# Patient Record
Sex: Male | Born: 1960 | Race: White | Hispanic: No | Marital: Married | Smoking: Former smoker
Health system: Southern US, Community
[De-identification: ages and names within clinical notes are randomized; demographics above are authoritative.]

## PROBLEM LIST (undated history)

## (undated) DIAGNOSIS — I4892 Unspecified atrial flutter: Secondary | ICD-10-CM

## (undated) DIAGNOSIS — N200 Calculus of kidney: Secondary | ICD-10-CM

## (undated) DIAGNOSIS — I2510 Atherosclerotic heart disease of native coronary artery without angina pectoris: Secondary | ICD-10-CM

## (undated) DIAGNOSIS — I10 Essential (primary) hypertension: Secondary | ICD-10-CM

## (undated) DIAGNOSIS — E669 Obesity, unspecified: Secondary | ICD-10-CM

## (undated) DIAGNOSIS — N201 Calculus of ureter: Secondary | ICD-10-CM

## (undated) DIAGNOSIS — E785 Hyperlipidemia, unspecified: Secondary | ICD-10-CM

## (undated) DIAGNOSIS — I509 Heart failure, unspecified: Secondary | ICD-10-CM

## (undated) DIAGNOSIS — E119 Type 2 diabetes mellitus without complications: Secondary | ICD-10-CM

## (undated) HISTORY — DX: Calculus of kidney: N20.0

## (undated) HISTORY — DX: Obesity, unspecified: E66.9

## (undated) HISTORY — DX: Atherosclerotic heart disease of native coronary artery without angina pectoris: I25.10

## (undated) HISTORY — DX: Unspecified atrial flutter: I48.92

## (undated) HISTORY — DX: Essential (primary) hypertension: I10

## (undated) HISTORY — PX: OTHER SURGICAL HISTORY: SHX169

## (undated) HISTORY — DX: Hyperlipidemia, unspecified: E78.5

## (undated) HISTORY — DX: Type 2 diabetes mellitus without complications: E11.9

---

## 2001-07-18 ENCOUNTER — Ambulatory Visit (HOSPITAL_COMMUNITY): Admission: RE | Admit: 2001-07-18 | Discharge: 2001-07-18 | Payer: Self-pay | Admitting: Family Medicine

## 2001-07-18 ENCOUNTER — Encounter: Payer: Self-pay | Admitting: Family Medicine

## 2001-11-28 ENCOUNTER — Encounter: Payer: Self-pay | Admitting: Family Medicine

## 2001-11-28 ENCOUNTER — Ambulatory Visit (HOSPITAL_COMMUNITY): Admission: RE | Admit: 2001-11-28 | Discharge: 2001-11-28 | Payer: Self-pay | Admitting: Family Medicine

## 2003-02-01 ENCOUNTER — Encounter: Payer: Self-pay | Admitting: Family Medicine

## 2003-02-01 ENCOUNTER — Ambulatory Visit (HOSPITAL_COMMUNITY): Admission: RE | Admit: 2003-02-01 | Discharge: 2003-02-01 | Payer: Self-pay | Admitting: Family Medicine

## 2003-08-05 ENCOUNTER — Other Ambulatory Visit: Admission: RE | Admit: 2003-08-05 | Discharge: 2003-08-05 | Payer: Self-pay | Admitting: Dermatology

## 2004-07-02 ENCOUNTER — Ambulatory Visit (HOSPITAL_COMMUNITY): Admission: RE | Admit: 2004-07-02 | Discharge: 2004-07-02 | Payer: Self-pay | Admitting: Family Medicine

## 2004-09-13 HISTORY — PX: OTHER SURGICAL HISTORY: SHX169

## 2005-01-12 ENCOUNTER — Emergency Department (HOSPITAL_COMMUNITY): Admission: EM | Admit: 2005-01-12 | Discharge: 2005-01-13 | Payer: Self-pay | Admitting: *Deleted

## 2005-01-13 ENCOUNTER — Inpatient Hospital Stay (HOSPITAL_COMMUNITY): Admission: AD | Admit: 2005-01-13 | Discharge: 2005-01-15 | Payer: Self-pay | Admitting: *Deleted

## 2005-01-13 HISTORY — PX: CARDIAC CATHETERIZATION: SHX172

## 2005-02-12 ENCOUNTER — Encounter: Admission: RE | Admit: 2005-02-12 | Discharge: 2005-02-12 | Payer: Self-pay | Admitting: *Deleted

## 2005-05-07 ENCOUNTER — Emergency Department (HOSPITAL_COMMUNITY): Admission: EM | Admit: 2005-05-07 | Discharge: 2005-05-07 | Payer: Self-pay | Admitting: Emergency Medicine

## 2005-08-27 IMAGING — CR DG CHEST 2V
2 series · 2 of 2 positions shown · non-contrast
Comparison: 01/12/05.

CLINICAL DATA: Myocardial infarction last month.  Tightness in the chest.  Stent placement last month. 
 2-VIEW CHEST RADIOGRAPH:

[w chest pa]
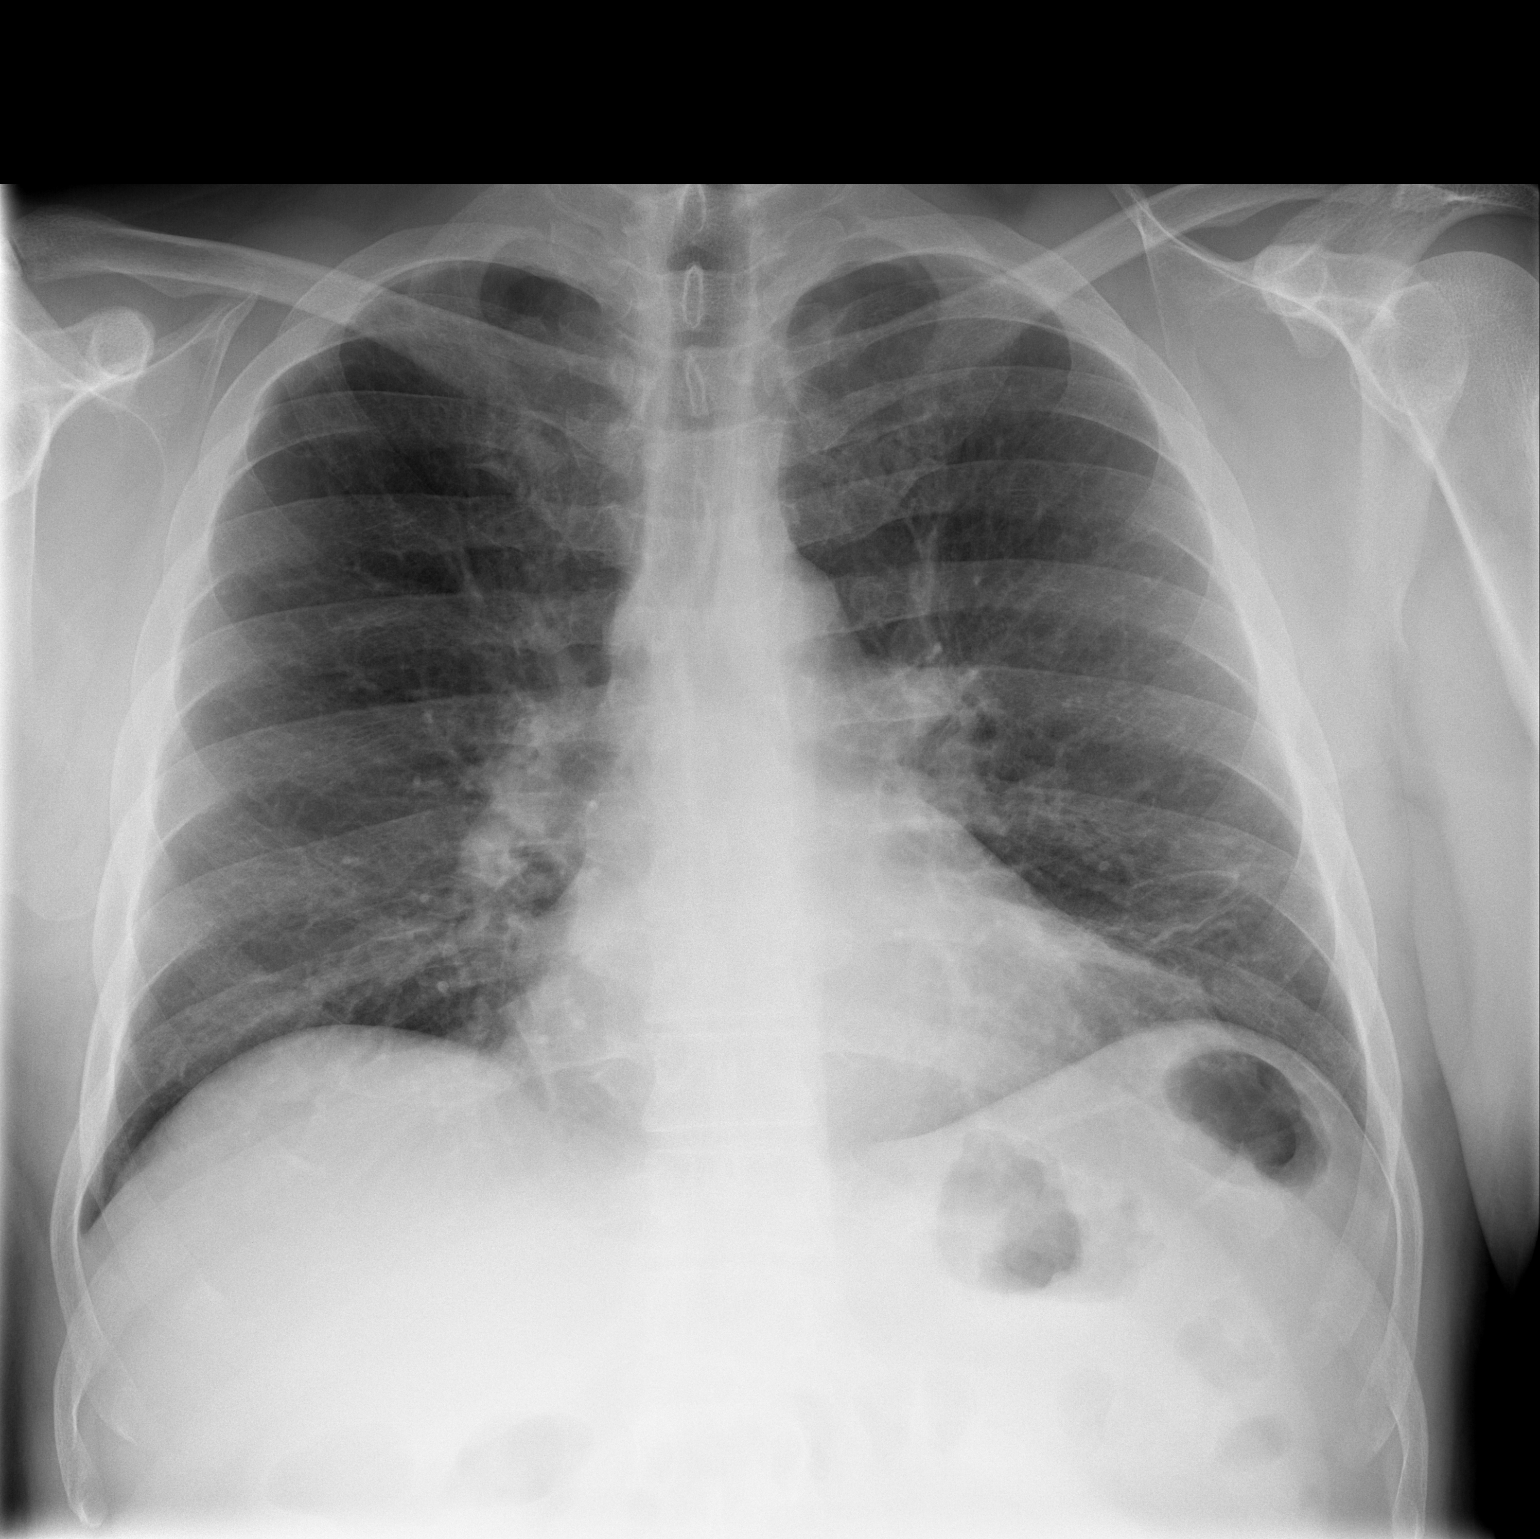

[w chest lat]
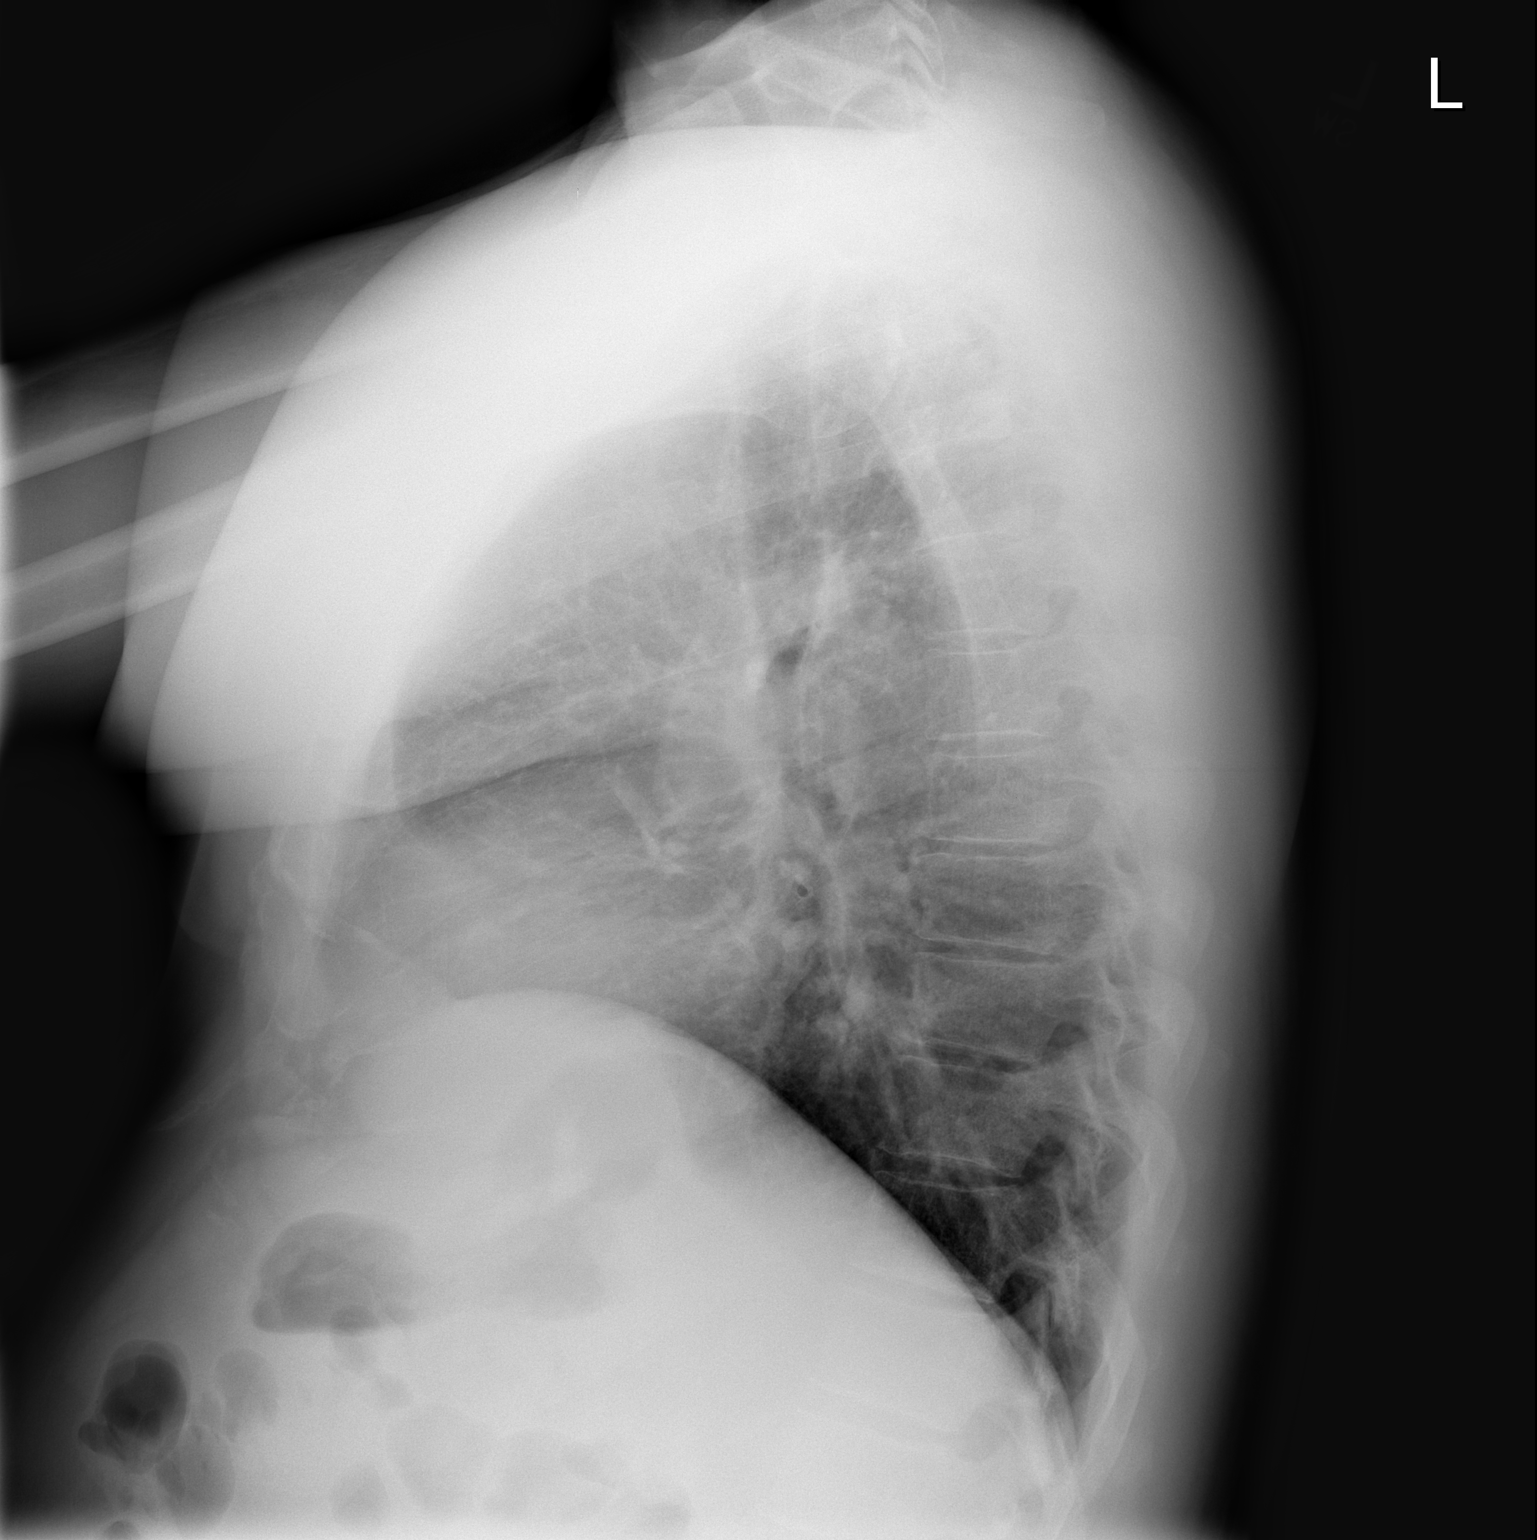

[2 of 2 positions shown; findings below may reference images not displayed]

FINDINGS: There is stable lingular scar.  No edema.  No pleural effusion.  The lungs appear otherwise clear.  Heart size is a the upper limits of normal.
IMPRESSION: Lingular scar.  No acute findings.

## 2005-09-21 ENCOUNTER — Ambulatory Visit (HOSPITAL_COMMUNITY): Admission: RE | Admit: 2005-09-21 | Discharge: 2005-09-21 | Payer: Self-pay | Admitting: Family Medicine

## 2007-05-05 ENCOUNTER — Ambulatory Visit (HOSPITAL_COMMUNITY): Admission: RE | Admit: 2007-05-05 | Discharge: 2007-05-05 | Payer: Self-pay | Admitting: *Deleted

## 2007-05-12 HISTORY — PX: OTHER SURGICAL HISTORY: SHX169

## 2008-02-07 ENCOUNTER — Ambulatory Visit (HOSPITAL_BASED_OUTPATIENT_CLINIC_OR_DEPARTMENT_OTHER): Admission: RE | Admit: 2008-02-07 | Discharge: 2008-02-07 | Payer: Self-pay | Admitting: Orthopedic Surgery

## 2009-11-19 ENCOUNTER — Ambulatory Visit (HOSPITAL_COMMUNITY): Admission: RE | Admit: 2009-11-19 | Discharge: 2009-11-19 | Payer: Self-pay | Admitting: Internal Medicine

## 2010-03-04 ENCOUNTER — Encounter: Admission: RE | Admit: 2010-03-04 | Discharge: 2010-03-04 | Payer: Self-pay | Admitting: Obstetrics and Gynecology

## 2010-12-17 ENCOUNTER — Emergency Department (HOSPITAL_COMMUNITY)
Admission: EM | Admit: 2010-12-17 | Discharge: 2010-12-17 | Disposition: A | Discharge status: Home / Self Care | Payer: BC Managed Care – PPO | Attending: Emergency Medicine | Admitting: Emergency Medicine

## 2010-12-17 ENCOUNTER — Emergency Department (HOSPITAL_COMMUNITY): Payer: BC Managed Care – PPO

## 2010-12-17 DIAGNOSIS — I2510 Atherosclerotic heart disease of native coronary artery without angina pectoris: Secondary | ICD-10-CM | POA: Insufficient documentation

## 2010-12-17 DIAGNOSIS — I4891 Unspecified atrial fibrillation: Secondary | ICD-10-CM | POA: Insufficient documentation

## 2010-12-17 DIAGNOSIS — I252 Old myocardial infarction: Secondary | ICD-10-CM | POA: Insufficient documentation

## 2010-12-17 DIAGNOSIS — E119 Type 2 diabetes mellitus without complications: Secondary | ICD-10-CM | POA: Insufficient documentation

## 2010-12-17 LAB — BASIC METABOLIC PANEL
Calcium: 9.3 mg/dL (ref 8.4–10.5)
Chloride: 107 mEq/L (ref 96–112)
Creatinine, Ser: 1.23 mg/dL (ref 0.4–1.5)
GFR calc non Af Amer: >60 mL/min (ref >60)
Potassium: 3.9 mEq/L (ref 3.5–5.1)
Sodium: 138 mEq/L (ref 135–145)

## 2010-12-17 LAB — POCT CARDIAC MARKERS: CKMB, poc: 1.8 ng/mL (ref 1.0–8.0)

## 2010-12-17 LAB — CBC
HCT: 40.9 % (ref 39.0–52.0)
MCH: 30.8 pg (ref 26.0–34.0)
MCHC: 34.2 g/dL (ref 30.0–36.0)
RDW: 12.9 % (ref 11.5–15.5)
WBC: 5.7 10*3/uL (ref 4.0–10.5)

## 2010-12-17 LAB — DIFFERENTIAL
Eosinophils Absolute: 0.2 10*3/uL (ref 0.0–0.7)
Lymphocytes Relative: 19 % (ref 12–46)
Neutro Abs: 3.8 10*3/uL (ref 1.7–7.7)

## 2010-12-17 LAB — BRAIN NATRIURETIC PEPTIDE: Pro B Natriuretic peptide (BNP): <30 pg/mL (ref 0.0–100.0)

## 2010-12-23 ENCOUNTER — Ambulatory Visit (HOSPITAL_COMMUNITY)
Admission: RE | Admit: 2010-12-23 | Discharge: 2010-12-23 | Disposition: A | Discharge status: Home / Self Care | Payer: BC Managed Care – PPO | Source: Ambulatory Visit | Attending: Internal Medicine | Admitting: Internal Medicine

## 2010-12-23 DIAGNOSIS — I4891 Unspecified atrial fibrillation: Secondary | ICD-10-CM | POA: Insufficient documentation

## 2010-12-23 DIAGNOSIS — Z5309 Procedure and treatment not carried out because of other contraindication: Secondary | ICD-10-CM | POA: Insufficient documentation

## 2011-01-26 NOTE — Op Note (Signed)
NAME:  [name], [name]                 ACCOUNT NO.:  [id number]   MEDICAL RECORD NO.:  [id number]          PATIENT TYPE:  AMB   LOCATION:  DSC                          FACILITY:  [facility]   PHYSICIAN:  Matthew A. Weingold, M.D.DATE OF BIRTH:  12-19-1960   DATE OF PROCEDURE:  DATE OF DISCHARGE:                               OPERATIVE REPORT   PREOP DIAGNOSES:  Chronic right carpal tunnel syndrome and chronic right  cubital tunnel syndrome.   POSTOP DIAGNOSES:  Chronic right carpal tunnel syndrome and chronic  right cubital tunnel syndrome.   PROCEDURES:  1. Cubital tunnel release, right elbow.  2. Carpal tunnel release, right wrist.   SURGEON:  Artist Pais. Mina Marble, MD   ASSISTANT:  None.   ANESTHESIA:  General.   TOURNIQUET TIME:  42 minutes.   COMPLICATIONS:  None.   DRAINS:  None.   OPERATIVE REPORT:  The patient was taken to operating suite. After  induction of adequate general anesthesia, the right upper extremity was  prepped and draped in sterile fashion. An Esmarch was used to  exsanguinate the limb. Tourniquet was inflated to 250 mmHg. At this  point in time, a 2-cm incision was made in the palmar aspect of the  right hand in line of long metacarpal starting Kaplan cardinal's line.  Skin was incised.  Palmar fascia was identified and split.  Distal edge  of the transverse carpal ligament was identified and split with a 15  blade.  Median nerve was identified and protected with a Therapist, nutritional.  The remaining aspects of the transverse carpal ligament was then divided  under direct vision using curved blunt scissors.  Canal was inspected.  There were no osseous lesions or ganglions present.  It was irrigated  and loosely closed with a 3-0 Prolene subcuticular stitch.  A second  incision was made on the medial aspect of the left elbow, was taken  between the olecranon process and medial epicondyle.  The skin was  incised.  Care was taken to identify and protect branches  of the medial  antebrachial cutaneous nerve.  Cubital tunnel was identified.  Ulnar  nerve was unroofed to the tubal tunnel out to the flexor carpi ulnaris  muscle fascia distally and proximally under skin bridge to 68 cm to  include release of the intramuscular septum and  ligament Struthers.  The wound was then irrigated.  The nerve was  stable.  Ulnar groove was closed in layers with 2-0 Vicryl and 3-0  Prolene subcuticular stitch on the skin.  Steri-Strips, 4x4s fluffs, and  compressed dressing was applied.  The patient tolerated the procedures  well and went to recovery room in stable fashion.      Artist Pais Mina Marble, M.D.  Electronically Signed     MAW/MEDQ  D:  02/07/2008  T:  02/08/2008  Job:  [id number]

## 2011-01-29 NOTE — Cardiovascular Report (Signed)
[name], [name]                 ACCOUNT NO.:  [id number]   MEDICAL RECORD NO.:  [id number]          PATIENT TYPE:  INP   LOCATION:  2922                         FACILITY:  [facility]   PHYSICIAN:  Darlin Priestly, MD  DATE OF BIRTH:  01/22/1961   DATE OF PROCEDURE:  01/13/2005  DATE OF DISCHARGE:                              CARDIAC CATHETERIZATION   PROCEDURE:  1.  Left heart catheterization.  2.  Coronary angiography.  3.  Left ventriculography.  4.  RCA-percutaneous transluminal coronary balloon angioplasty--placement of      intracoronary stent.   COMPLICATIONS:  None.   INDICATIONS FOR PROCEDURE:  [name] is a 50 year old male patient of [name], M.D. with a history of hypertension and ongoing tobacco abuse who  developed exceptional chest pain on the evening of Jan 12, 2005 at  approximately 10:45 p.m.  He subsequently presented to [facility]  approximately midnight where he was noted to have an acute inferior ST  elevation MI.  He is now transferred to [facility] emergently for PCI.   DESCRIPTION OF PROCEDURE:  After informed consent, the patient was brought  to the cardiac cath lab in a fasted state. The right and left groins were  shaved, prepped and draped in the usual sterile fashion. The ECG monitoring  was established. Using modified Seldinger technique, a #7 [place] arterial  sheath inserted in the right femoral artery. The 6 French left catheters  were then used to perform left coronary anatomy.   The left main was a large vessel with no significant disease.   The LAD is a medium sized vessel which coursed the apex with two diagonal  branches. The LAD has no significant disease. The first and second diagonals  are small vessels with no significant disease.   The left coronary artery gives rise to a medium sized ramus intermedius  which bifurcates distally. There is mild 20% ostial narrowing.   The left circumflex is a large vessel which gives  rise to three obtuse  marginal branches. The _________ circumflex has no significant disease.   The first and second OM's are medium size vessels with no significant  disease. The third ramus is a small vessel with no significant disease.   The right coronary artery is a large vessel which dominates which gives rise  to both PDA and lateral branch. There is mild 30% proximal narrowing. There  is 99% mid RCA narrowing with haziness and possible block. The remainder of  the RCA, _________ lateral branch have no significant disease.   LEFT VENTRICULOGRAM:  Reveals preserved EF at 60%.   HEMODYNAMICS:  Systemic arterial pressure 120/79, LV systemic pressure  120/8, LVDP of 16.   INTERVENTIONAL PROCEDURE:  RCA--mid.  Following diagnostic angiography, the  7 Jamaica JR-4 guiding catheter was progressively engaged in the right  coronary ostium. Next a 0.014 Forte guidewire was advanced by the guiding  catheter and used to cross the mid RCA stenotic lesion. The guidewire was  then positioned in the PDA without difficulty. Next, a 3.0 x 15 mm Maverick  is  then tracked across the mid RCA stenotic lesions. Two inflation to a  maximum of 10 atmospheres performed for a total of approximately one minute.  Followup angiogram revealed good luminal gain with no evidence of dissection  or thrombus. This balloon was then removed. A CYPHER 3.5 x 23 mm stent was  then positioned in the mid RCA positioned across the stenotic lesion. The  stent was then deployed to 12 atmospheres for a total of approximately 30  seconds. Followup angiogram revealed no evidence of dissection or thrombus.  A second inflation to approximately 14 atmospheres was performed for a total  of approximately 20 seconds. Followup angiogram revealed no evidence of  dissection or thrombus, with TIMI 3 flow to the distal vessel. IV Integrilin  used throughout the case. An intravenous dose of heparin given to maintain  the ACT between 2 and  300.   Final _________ angiogram revealed less than 10% residual stenosis in the  mid RCA stenotic lesion with TIMI 3 flow to the distal vessel. At this  point, we elected to conclude the procedure. All balloons, wires and  catheters were removed. Hemostatic sheaths were sewn in place. The patient  was transferred back to the ward in stable condition.   CONCLUSION:  1.  Successful percutaneous transluminal coronary angioplasty and placement      of a CYPHER 3.5 x 23 mm stent in the mid RCA stenotic lesion.  2.  Normal LV systolic function.  3.  Adjunct use of Integrilin infusion.      RHM/MEDQ  D:  01/13/2005  T:  01/13/2005  Job:  [id number]   cc:   [name], M.D.  [address], [address]  [place]  [place] [address]  Fax: [phone]

## 2011-01-29 NOTE — Discharge Summary (Signed)
[name], [name]                 ACCOUNT NO.:  [id number]   MEDICAL RECORD NO.:  [id number]          PATIENT TYPE:  INP   LOCATION:  2024                         FACILITY:  [facility]   PHYSICIAN:  Darlin Priestly, MD  DATE OF BIRTH:  1961/09/08   DATE OF ADMISSION:  01/13/2005  DATE OF DISCHARGE:  01/15/2005                                 DISCHARGE SUMMARY   DISCHARGE DIAGNOSIS:  1. Inferior subendocardial myocardial infarction treated with CYPHER stent      to the right coronary artery. This admission.  2. Hypertension.  3. History smoking.     HOSPITAL COURSE:  The patient of 50 year old male with history of  hypertension and smoking who presented to [facility] Emergency Room  at 11:30 p.m. on the Jan 12, 2005, with chest pain. It developed about 10:45  any evening. EKG showed inferior ST elevation. The patient was transferred  to [facility] for urgent cath. He was treated with  heparin and nitrates and Integrilin. He was taken to the cath lab urgently  by Dr. Jenne Campus at 1 o'clock the morning. This revealed a 99% mid RCA  stenosis with no other significant disease. The RCA lesion was angioplastied  and stented with a CYPHER stent with good results. The patient tolerated  procedure well. He was kept on Integrilin 18 hours. He was transferred to  the unit and monitored. CKs peaked at 412 with 45 MBs. We feel he could be  discharged Jan 15, 2005. He did have some night time bradycardia.  This was  reviewed by Dr. Jacinto Halim at discharge, Dr. Jacinto Halim felt he could be discharged on  Toprol XL 50 mg a day.   DISCHARGE MEDICATIONS:  1. Toprol XL 50 mg a day.  2. Plavix 75 mg a day.  3. Coated aspirin daily.  4. Lipitor 80 mg a day.  5. Wellbutrin 150 b.i.d. for smoking.  6. Altace 5 mg b.i.d.  7. Inspra 25 mg a day.  8. Nitroglycerin sublingual p.r.n.     LABS:  Sodium 137, potassium 4.0, BUN 9, creatinine 1.1.  White count 11.2,  hemoglobin 14.6,  hematocrit 42.1, platelets 318   Chest x-ray  shows some pulmonary vascular congestion without acute process.   His EKG shows sinus rhythm without acute changes.   DISPOSITION:  The patient discharged stable condition. He will see Dr.  Jenne Campus in the [place], [place], office in a couple weeks and then  arrange follow-up and [place], [place], with Dr. Jenne Campus.      LKK/MEDQ  D:  01/15/2005  T:  01/15/2005  Job:  [id number]

## 2011-06-09 LAB — BASIC METABOLIC PANEL
BUN: 16
CO2: 27
Calcium: 9.1
Chloride: 105
Creatinine, Ser: 0.99
GFR calc Af Amer: >60
GFR calc non Af Amer: >60
Glucose, Bld: 111 — ABNORMAL HIGH
Potassium: 4.6
Sodium: 137

## 2011-06-09 LAB — POCT HEMOGLOBIN-HEMACUE: Hemoglobin: 14.3

## 2012-08-14 ENCOUNTER — Other Ambulatory Visit (HOSPITAL_COMMUNITY): Payer: Self-pay | Admitting: Cardiovascular Disease

## 2012-08-14 DIAGNOSIS — I480 Paroxysmal atrial fibrillation: Secondary | ICD-10-CM

## 2012-08-17 ENCOUNTER — Ambulatory Visit (HOSPITAL_COMMUNITY)
Admission: RE | Admit: 2012-08-17 | Discharge: 2012-08-17 | Disposition: A | Discharge status: Home / Self Care | Payer: BC Managed Care – PPO | Source: Ambulatory Visit | Attending: Cardiovascular Disease | Admitting: Cardiovascular Disease

## 2012-08-17 DIAGNOSIS — I079 Rheumatic tricuspid valve disease, unspecified: Secondary | ICD-10-CM | POA: Insufficient documentation

## 2012-08-17 DIAGNOSIS — I2510 Atherosclerotic heart disease of native coronary artery without angina pectoris: Secondary | ICD-10-CM | POA: Insufficient documentation

## 2012-08-17 DIAGNOSIS — E119 Type 2 diabetes mellitus without complications: Secondary | ICD-10-CM | POA: Insufficient documentation

## 2012-08-17 DIAGNOSIS — I4891 Unspecified atrial fibrillation: Secondary | ICD-10-CM | POA: Insufficient documentation

## 2012-08-17 DIAGNOSIS — I517 Cardiomegaly: Secondary | ICD-10-CM | POA: Insufficient documentation

## 2012-08-17 DIAGNOSIS — E669 Obesity, unspecified: Secondary | ICD-10-CM | POA: Insufficient documentation

## 2012-08-17 DIAGNOSIS — I480 Paroxysmal atrial fibrillation: Secondary | ICD-10-CM

## 2012-08-17 NOTE — Progress Notes (Signed)
[facility]   2D echo completed 08/17/2012.   Cindy Leialoha Hanna, RDCS   

## 2013-01-24 ENCOUNTER — Other Ambulatory Visit: Payer: Self-pay | Admitting: Cardiovascular Disease

## 2013-01-24 LAB — CBC WITH DIFFERENTIAL/PLATELET
HCT: 38.9 % — ABNORMAL LOW (ref 39.0–52.0)
Lymphocytes Relative: 24 % (ref 12–46)
MCH: 30 pg (ref 26.0–34.0)
MCHC: 34.2 g/dL (ref 30.0–36.0)
MCV: 87.8 fL (ref 78.0–100.0)
Monocytes Absolute: 0.9 10*3/uL (ref 0.1–1.0)
Neutro Abs: 3.8 10*3/uL (ref 1.7–7.7)
Neutrophils Relative %: 58 % (ref 43–77)
Platelets: 169 10*3/uL (ref 150–400)
RBC: 4.43 MIL/uL (ref 4.22–5.81)

## 2013-01-24 LAB — LIPID PANEL
Cholesterol: 90 mg/dL (ref 0–200)
HDL: 30 mg/dL — ABNORMAL LOW (ref >39)
LDL Cholesterol: 34 mg/dL (ref 0–99)
Total CHOL/HDL Ratio: 3 Ratio
Triglycerides: 129 mg/dL (ref <150)

## 2013-01-24 LAB — TSH: TSH: 1.652 u[IU]/mL (ref 0.350–4.500)

## 2013-01-24 LAB — COMPREHENSIVE METABOLIC PANEL
BUN: 22 mg/dL (ref 6–23)
CO2: 27 mEq/L (ref 19–32)
Chloride: 105 mEq/L (ref 96–112)
Creat: 1.41 mg/dL — ABNORMAL HIGH (ref 0.50–1.35)
Glucose, Bld: 135 mg/dL — ABNORMAL HIGH (ref 70–99)
Potassium: 4.7 mEq/L (ref 3.5–5.3)

## 2013-06-06 ENCOUNTER — Encounter: Payer: Self-pay | Admitting: Cardiovascular Disease

## 2013-06-15 ENCOUNTER — Other Ambulatory Visit: Payer: Self-pay | Admitting: Cardiovascular Disease

## 2013-06-15 LAB — CBC WITH DIFFERENTIAL/PLATELET
Basophils Absolute: 0 10*3/uL (ref 0.0–0.1)
Eosinophils Absolute: 0.2 10*3/uL (ref 0.0–0.7)
Lymphocytes Relative: 35 % (ref 12–46)
MCHC: 34.9 g/dL (ref 30.0–36.0)
MCV: 85.9 fL (ref 78.0–100.0)
Monocytes Absolute: 0.6 10*3/uL (ref 0.1–1.0)
Neutro Abs: 3.1 10*3/uL (ref 1.7–7.7)
Platelets: 194 10*3/uL (ref 150–400)
RDW: 13.7 % (ref 11.5–15.5)
WBC: 6.1 10*3/uL (ref 4.0–10.5)

## 2013-06-15 LAB — COMPREHENSIVE METABOLIC PANEL
ALT: 19 U/L (ref 0–53)
AST: 17 U/L (ref 0–37)
Albumin: 4.3 g/dL (ref 3.5–5.2)
Alkaline Phosphatase: 41 U/L (ref 39–117)
Creat: 1.1 mg/dL (ref 0.50–1.35)
Glucose, Bld: 112 mg/dL — ABNORMAL HIGH (ref 70–99)
Total Bilirubin: 0.5 mg/dL (ref 0.3–1.2)

## 2013-07-10 ENCOUNTER — Other Ambulatory Visit (HOSPITAL_COMMUNITY): Payer: Self-pay | Admitting: Internal Medicine

## 2013-07-10 DIAGNOSIS — N200 Calculus of kidney: Secondary | ICD-10-CM

## 2013-07-10 DIAGNOSIS — R319 Hematuria, unspecified: Secondary | ICD-10-CM

## 2013-07-12 ENCOUNTER — Ambulatory Visit (HOSPITAL_COMMUNITY): Payer: BC Managed Care – PPO

## 2013-07-30 ENCOUNTER — Ambulatory Visit: Payer: BC Managed Care – PPO | Admitting: Cardiovascular Disease

## 2013-08-07 ENCOUNTER — Ambulatory Visit: Payer: BC Managed Care – PPO | Admitting: Cardiovascular Disease

## 2013-09-14 ENCOUNTER — Ambulatory Visit (HOSPITAL_COMMUNITY)
Admission: RE | Admit: 2013-09-14 | Discharge: 2013-09-14 | Disposition: A | Discharge status: Home / Self Care | Payer: BC Managed Care – PPO | Source: Ambulatory Visit | Attending: Internal Medicine | Admitting: Internal Medicine

## 2013-09-14 DIAGNOSIS — K8020 Calculus of gallbladder without cholecystitis without obstruction: Secondary | ICD-10-CM | POA: Insufficient documentation

## 2013-09-14 DIAGNOSIS — R319 Hematuria, unspecified: Secondary | ICD-10-CM | POA: Insufficient documentation

## 2013-09-14 DIAGNOSIS — N200 Calculus of kidney: Secondary | ICD-10-CM | POA: Insufficient documentation

## 2013-09-14 DIAGNOSIS — N201 Calculus of ureter: Secondary | ICD-10-CM | POA: Insufficient documentation

## 2013-09-14 DIAGNOSIS — N1330 Unspecified hydronephrosis: Secondary | ICD-10-CM | POA: Insufficient documentation

## 2013-09-14 DIAGNOSIS — K5730 Diverticulosis of large intestine without perforation or abscess without bleeding: Secondary | ICD-10-CM | POA: Insufficient documentation

## 2013-09-14 DIAGNOSIS — R1032 Left lower quadrant pain: Secondary | ICD-10-CM | POA: Insufficient documentation

## 2013-09-21 ENCOUNTER — Ambulatory Visit: Payer: BC Managed Care – PPO | Admitting: Cardiovascular Disease

## 2013-09-24 ENCOUNTER — Telehealth: Payer: Self-pay | Admitting: Cardiovascular Disease

## 2013-09-24 NOTE — Telephone Encounter (Signed)
LMTCB ./CY 

## 2013-09-24 NOTE — Telephone Encounter (Signed)
R/S  PT'S APPT TO TOM  WITH  CHRIS  BERGE NP PER  DR NISHAN  PT NEEDS  LITHOTRIPSY  ON MON ./CY PT  AWARE./CY

## 2013-09-24 NOTE — Telephone Encounter (Signed)
New message    Pt needs lithotripsy---can they hold his plavix for 5 days and aspirin for 3days?

## 2013-09-25 ENCOUNTER — Encounter: Payer: Self-pay | Admitting: Nurse Practitioner

## 2013-09-25 ENCOUNTER — Ambulatory Visit (INDEPENDENT_AMBULATORY_CARE_PROVIDER_SITE_OTHER): Payer: BC Managed Care – PPO | Admitting: Nurse Practitioner

## 2013-09-25 ENCOUNTER — Other Ambulatory Visit: Payer: Self-pay | Admitting: Urology

## 2013-09-25 VITALS — BP 130/80 | HR 55 | Ht 70.0 in | Wt 257.8 lb

## 2013-09-25 DIAGNOSIS — I4891 Unspecified atrial fibrillation: Secondary | ICD-10-CM

## 2013-09-25 DIAGNOSIS — E785 Hyperlipidemia, unspecified: Secondary | ICD-10-CM

## 2013-09-25 DIAGNOSIS — I2510 Atherosclerotic heart disease of native coronary artery without angina pectoris: Secondary | ICD-10-CM

## 2013-09-25 DIAGNOSIS — E669 Obesity, unspecified: Secondary | ICD-10-CM

## 2013-09-25 DIAGNOSIS — N200 Calculus of kidney: Secondary | ICD-10-CM

## 2013-09-25 DIAGNOSIS — I4892 Unspecified atrial flutter: Secondary | ICD-10-CM

## 2013-09-25 DIAGNOSIS — I10 Essential (primary) hypertension: Secondary | ICD-10-CM

## 2013-09-25 DIAGNOSIS — E119 Type 2 diabetes mellitus without complications: Secondary | ICD-10-CM | POA: Insufficient documentation

## 2013-09-25 NOTE — Patient Instructions (Signed)
Your physician recommends that you continue on your current medications as directed. Please refer to the Current Medication list given to you today.  Your physician wants you to follow-up in: 6 months with Dr. Nishan. You will receive a reminder letter in the mail two months in advance. If you don't receive a letter, please call our office to schedule the follow-up appointment.  

## 2013-09-25 NOTE — Progress Notes (Signed)
Patient Name: [name] Date of Encounter: 09/25/2013  Primary Care Provider:  Delphina Cahill, MD Primary Cardiologist:  P. Johnsie Cancel, MD (formerly [place])  Patient Profile  53 year old male with history of CAD status post stenting of the right coronary artery in the setting of a non-ST elevation MI in 2006, who presents for followup with pending lithotripsy.  Problem List   Past Medical History  Diagnosis Date  . CAD (coronary artery disease)     a. 01/2005 NSTEMI/PCI: LM nl, LAD nl, RI 20 ost, LCX nl, RCA 30p/52m (3.5x23 Cypher DES), EF 60%  . HTN (hypertension)   . Diabetes mellitus, type II   . Hyperlipidemia   . Atrial flutter     a. Dx 12/2010 - briefly on coumadin in 2012, CHADS2 = 2/CHA2DS2VASc = 3;  b. No recurrence, on Multaq;  c. 08/2012 Echo: EF 55-60%.  . Nephrolithiasis     a. s/p lithotripsy  . Obesity    No past surgical history on file.  Allergies  Not on File  HPI  53 year old male with a prior history of coronary artery disease, paroxysmal atrial flutter,hypertension, hyperlipidemia, diabetes mellitus, and nephrolithiasis.  He suffered a non-ST segment elevation myocardial infarction in May 2006, and underwent successful PCI and drug loading stent placement to the mid right coronary artery.  In 2012, he developed palpitations and was found to have atrial flutter.  He was initially placed on diltiazem and Coumadin therapy and scheduled for a TEE and cardioversion however the patient spontaneously converted to sinus rhythm.  At some point, his Coumadin was discontinued and he was instead placed on multaq, , which he remains on today.  He has not had any more episodes of palpitations or atrial flutter since April 2012.  He has been on chronic aspirin and Plavix therapy since his MI in 2006.  He has previously been followed closely by Dr. Rollene Fare and is now being transitioned to Dr. Johnsie Cancel.  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  He also was walking up to 3 miles on a regular basis with his wife but hasn't recently in the setting of a cold snap.  He denies PND, orthopnea, dizziness, syncope, edema, or early satiety.  He has had flank pain related to kidney stones and has been evaluated by urology with a plan for lithotripsy.  He presents today for clearance to come off of his aspirin Plavix for lithotripsy.  He has come off of both lithotripsy in the past without any palpitations.  He also notes that sometimes he goes days that time forgetting to take his Plavix.  Home Medications  Prior to Admission medications   Medication Sig Start Date End Date Taking? Authorizing Provider  aspirin 81 MG tablet Take 81 mg by mouth daily.   Yes Historical Provider, MD  Cinnamon 500 MG TABS Take 1 tablet by mouth 2 (two) times daily.   Yes Historical Provider, MD  clopidogrel (PLAVIX) 75 MG tablet Take 75 mg by mouth once.  09/22/13  Yes Historical Provider, MD  CRESTOR 20 MG tablet Take 20 mg by mouth daily.  09/19/13  Yes Historical Provider, MD  HYDROcodone-acetaminophen (NORCO/VICODIN) 5-325 MG per tablet Take 1 tablet by mouth every 4 (four) hours as needed.  09/14/13  Yes Historical Provider, MD  KOMBIGLYZE XR 01-999 MG TB24 Take 1 tablet by mouth daily.  09/22/13  Yes Historical Provider, MD  metoprolol succinate (TOPROL-XL) 50 MG 24 hr tablet Take 75 mg by mouth daily.  09/19/13  Yes Historical Provider, MD  MULTAQ 400 MG tablet Take 400 mg by mouth 2 (two) times daily with a meal.  09/19/13  Yes Historical Provider, MD  niacin (NIASPAN) 1000 MG CR tablet Take 1,000 mg by mouth at bedtime.  09/19/13  Yes Historical Provider, MD  Omega-3 Fatty Acids (FISH OIL) 1200 MG CAPS Take 1 capsule by mouth 2 (two) times daily.   Yes Historical Provider, MD   Review of Systems  He denies chest pain, palpitations, dyspnea, pnd, orthopnea, n, v, dizziness, syncope, edema, weight gain, or  early satiety.  All other systems reviewed and are otherwise negative except as noted above.  Physical Exam  Blood pressure 130/80, pulse 55, height 5\' 10"  (1.778 m), weight 257 lb 12.8 oz (116.937 kg).  General: Pleasant, NAD Psych: Normal affect. Neuro: Alert and oriented X 3. Moves all extremities spontaneously. HEENT: Normal  Neck: Supple without bruits or JVD. Lungs:  Resp regular and unlabored, CTA. Heart: RRR no s3, s4, or murmurs. Abdomen: Soft, non-tender, non-distended, BS + x 4.  Extremities: No clubbing, cyanosis or edema. DP/PT/Radials 2+ and equal bilaterally.  Accessory Clinical Findings  ECG - sinus bradycardia, 55, no acute ST-T changes.  Assessment & Plan  1. Coronary artery disease: Patient status post successful PCI and stenting of the right coronary artery in May 2006 the setting of a non-ST elevation MI.  He has done very well since that time without recurrence of chest pain despite a fairly active lifestyle.  He is pending lithotripsy next Monday and will need to come off of his aspirin and Plavix to have this performed.  As he is now nearly 9 years out of his MI and drug-eluting stent, he may safely come off of aspirin and Plavix to have this procedure performed.  He will resume both following lithotripsy.  As he is very active without any symptoms limitations, he will not require any ischemic testing prior to lithotripsy. In the future we can consider discontinuing Plavix altogether.  He remains on beta blocker and statin therapy.  2.  Hypertension: Stable.  Continue current regimen.  3.  Hyperlipidemia: He is on Crestor niacin therapy.  He has no complaints and tolerated these medications well.  His last LDL was 34 in May of 2014.  LFTs were normal at that time.  4.  Diabetes mellitus: He is on a combination pill per primary care.  5.  Paroxysmal atrial flutter: per patient, last known occurrence was April 2012 at which time he spontaneously converted. He is  maintained on Multaq therapy.  He had been on coumadin in 2012, but this was discontinued several years ago.  His CHADS2 = 2, CHA2DS2VASc = 3.  Would have a low threshold to initiate oral anticoagulation for any recurrence of atrial arrhythmias.  6.  Obesity: Patient has been exercising at home, walking regularly with his wife.  7.  Disposition: Followup with Dr.Nishan in 6 mos.  Murray Hodgkins, NP 09/25/2013, 10:31 AM

## 2013-09-26 ENCOUNTER — Encounter (HOSPITAL_COMMUNITY): Payer: Self-pay | Admitting: Pharmacy Technician

## 2013-09-27 ENCOUNTER — Encounter (HOSPITAL_COMMUNITY): Payer: Self-pay | Admitting: *Deleted

## 2013-10-01 ENCOUNTER — Ambulatory Visit (HOSPITAL_COMMUNITY)
Admission: RE | Admit: 2013-10-01 | Discharge: 2013-10-01 | Disposition: A | Discharge status: Home / Self Care | Payer: BC Managed Care – PPO | Source: Ambulatory Visit | Attending: Urology | Admitting: Urology

## 2013-10-01 ENCOUNTER — Ambulatory Visit (HOSPITAL_COMMUNITY): Payer: BC Managed Care – PPO

## 2013-10-01 ENCOUNTER — Encounter (HOSPITAL_COMMUNITY)
Admission: RE | Disposition: A | Discharge status: Home / Self Care | Payer: Self-pay | Source: Ambulatory Visit | Attending: Urology

## 2013-10-01 ENCOUNTER — Encounter (HOSPITAL_COMMUNITY): Payer: Self-pay | Admitting: General Practice

## 2013-10-01 DIAGNOSIS — I10 Essential (primary) hypertension: Secondary | ICD-10-CM | POA: Insufficient documentation

## 2013-10-01 DIAGNOSIS — E119 Type 2 diabetes mellitus without complications: Secondary | ICD-10-CM | POA: Insufficient documentation

## 2013-10-01 DIAGNOSIS — E7800 Pure hypercholesterolemia, unspecified: Secondary | ICD-10-CM | POA: Insufficient documentation

## 2013-10-01 DIAGNOSIS — Z7982 Long term (current) use of aspirin: Secondary | ICD-10-CM | POA: Insufficient documentation

## 2013-10-01 DIAGNOSIS — Z79899 Other long term (current) drug therapy: Secondary | ICD-10-CM | POA: Insufficient documentation

## 2013-10-01 DIAGNOSIS — I252 Old myocardial infarction: Secondary | ICD-10-CM | POA: Insufficient documentation

## 2013-10-01 DIAGNOSIS — N201 Calculus of ureter: Secondary | ICD-10-CM | POA: Diagnosis present

## 2013-10-01 HISTORY — DX: Calculus of ureter: N20.1

## 2013-10-01 LAB — GLUCOSE, CAPILLARY: Glucose-Capillary: 111 mg/dL — ABNORMAL HIGH (ref 70–99)

## 2013-10-01 SURGERY — LITHOTRIPSY, ESWL
Anesthesia: LOCAL | Laterality: Left

## 2013-10-01 MED ORDER — SODIUM CHLORIDE 0.9 % IJ SOLN: 3.0000 mL | INTRAMUSCULAR | Status: DC | PRN

## 2013-10-01 MED ORDER — ACETAMINOPHEN 325 MG PO TABS: 650.0000 mg | ORAL_TABLET | ORAL | Status: DC | PRN

## 2013-10-01 MED ORDER — OXYCODONE HCL 5 MG PO TABS: 5.0000 mg | ORAL_TABLET | ORAL | Status: DC | PRN

## 2013-10-01 MED ORDER — SODIUM CHLORIDE 0.9 % IV SOLN: 250.0000 mL | INTRAVENOUS | Status: DC | PRN

## 2013-10-01 MED ORDER — FENTANYL CITRATE 0.05 MG/ML IJ SOLN: 25.0000 ug | INTRAMUSCULAR | Status: DC | PRN

## 2013-10-01 MED ORDER — DIAZEPAM 5 MG PO TABS
10.0000 mg | ORAL_TABLET | ORAL | Status: AC
  Administered 2013-10-01: 10 mg via ORAL
  Filled 2013-10-01: qty 2

## 2013-10-01 MED ORDER — DIPHENHYDRAMINE HCL 25 MG PO CAPS
25.0000 mg | ORAL_CAPSULE | ORAL | Status: AC
  Administered 2013-10-01: 25 mg via ORAL
  Filled 2013-10-01: qty 1

## 2013-10-01 MED ORDER — SODIUM CHLORIDE 0.9 % IJ SOLN: 3.0000 mL | Freq: Two times a day (BID) | INTRAMUSCULAR | Status: DC

## 2013-10-01 MED ORDER — ONDANSETRON HCL 4 MG/2ML IJ SOLN: 4.0000 mg | Freq: Four times a day (QID) | INTRAMUSCULAR | Status: DC | PRN

## 2013-10-01 MED ORDER — DEXTROSE-NACL 5-0.45 % IV SOLN
INTRAVENOUS | Status: DC
  Administered 2013-10-01: 07:00:00 via INTRAVENOUS

## 2013-10-01 MED ORDER — ACETAMINOPHEN 650 MG RE SUPP
650.0000 mg | RECTAL | Status: DC | PRN
  Filled 2013-10-01: qty 1

## 2013-10-01 MED ORDER — CIPROFLOXACIN HCL 500 MG PO TABS
500.0000 mg | ORAL_TABLET | ORAL | Status: AC
  Administered 2013-10-01: 500 mg via ORAL
  Filled 2013-10-01: qty 1

## 2013-10-01 NOTE — Discharge Instructions (Signed)
Lithotripsy, Care After °Refer to this sheet in the next few weeks. These instructions provide you with information on caring for yourself after your procedure. Your health care provider may also give you more specific instructions. Your treatment has been planned according to current medical practices, but problems sometimes occur. Call your health care provider if you have any problems or questions after your procedure. °WHAT TO EXPECT AFTER THE PROCEDURE  °· Your urine may have a red tinge for a few days after treatment. Blood loss is usually minimal. °· You may have soreness in the back or flank area. This usually goes away after a few days. The procedure can cause blotches or bruises on the back where the pressure wave enters the skin. These marks usually cause only minimal discomfort and should disappear in a short time. °· Stone fragments should begin to pass within 24 hours of treatment. However, a delayed passage is not unusual. °· You may have pain, discomfort, and feel sick to your stomach (nauseated) when the crushed fragments of stone are passed down the tube from the kidney to the bladder. Stone fragments can pass soon after the procedure and may last for up to 4 8 weeks. °· A small number of patients may have severe pain when stone fragments are not able to pass, which leads to an obstruction. °· If your stone is greater than 1 inch (2.5 cm) in diameter or if you have multiple stones that have a combined diameter greater than 1 inch (2.5 cm), you may require more than one treatment. °· If you had a stent placed prior to your procedure, you may experience some discomfort, especially during urination. You may experience the pain or discomfort in your flank or back, or you may experience a sharp pain or discomfort at the base of your penis or in your lower abdomen. The discomfort usually lasts only a few minutes after urinating. °HOME CARE INSTRUCTIONS  °· Rest at home until you feel your energy  improving. °· Only take over-the-counter or prescription medicines for pain, discomfort, or fever as directed by your health care provider. Depending on the type of lithotripsy, you may need to take antibiotics and anti-inflammatory medicines for a few days. °· Drink enough water and fluids to keep your urine clear or pale yellow. This helps "flush" your kidneys. It helps pass any remaining pieces of stone and prevents stones from coming back. °· Most people can resume daily activities within 1 2 days after standard lithotripsy. It can take longer to recover from laser and percutaneous lithotripsy. °· If the stones are in your urinary system, you may be asked to strain your urine at home to look for stones. Any stones that are found can be sent to a medical lab for examination. °· Visit your health care provider for a follow-up appointment in a few weeks. Your doctor may remove your stent if you have one. Your health care provider will also check to see whether stone particles still remain. °SEEK MEDICAL CARE IF:  °· Your pain is not relieved by medicine. °· You have a lasting nauseous feeling. °· You feel there is too much blood in the urine. °· You develop persistent problems with frequent or painful urination that does not at least partially improve after 2 days following the procedure. °· You have a congested cough. °· You feel lightheaded. °· You develop a rash or any other signs that might suggest an allergic problem. °· You develop any reaction or side   effects to your medicine(s). SEEK IMMEDIATE MEDICAL CARE IF:   You experience severe back or flank pain or both.  You see nothing but blood when you urinate.  You cannot pass any urine at all.  You have a fever or shaking chills.  You develop shortness of breath, difficulty breathing, or chest pain.  You develop vomiting that will not stop after 6 8 hours.  You have a fainting episode. Document Released: 09/19/2007 Document Revised: 06/20/2013  Document Reviewed: 03/15/2013 [facility] Patient Information 2014 [place], [place].    You may resume the plavix and ASA in 48hrs.

## 2013-10-01 NOTE — H&P (Signed)
ctive Problems Problems   1. Calculus of left ureter (592.1)  History of Present Illness  [name] is a 53 yo WM who is sent in consultation by Dr. Wende Neighbors for stone disease.  He passed a stone about a month ago on the right but 2-3 days later he had left sided pain with radiation into the testicle and he had a CT that showed a 53mm left distal stone about 10 days ago.  He has had no gross hematuria.  He has some urgency.  His pain is not bad today.  He had some pain yesterday which is helped by Ibuprofen.  He has a history of recurrent stones and had one treated by Dr. [place] Pink remotely.  He is not sure what his stones are made of.   Past Medical History Problems   1. History of acute myocardial infarction (412)  2. History of atrial fibrillation (V12.59)  3. History of cardiac disorder (V12.50)  4. History of diabetes mellitus (V12.29)  5. History of hypercholesterolemia (V12.29)  Surgical History Problems   1. History of Cath Stent Placement  2. History of Lithotripsy  Current Meds  1. Aspirin 81 MG Oral Tablet;  Therapy: (Recorded:12Jan2015) to Recorded  2. Cinnamon CAPS;  Therapy: (Recorded:12Jan2015) to Recorded  3. Clopidogrel Bisulfate 75 MG Oral Tablet;  Therapy: (Recorded:12Jan2015) to Recorded  4. Crestor 20 MG Oral Tablet;  Therapy: (Recorded:12Jan2015) to Recorded  5. Fish Oil CAPS;  Therapy: (Recorded:12Jan2015) to Recorded  6. Hydrocodone-Acetaminophen 5-325 MG Oral Tablet;  Therapy: [id number] to Recorded  7. Kombiglyze XR 01-999 MG Oral Tablet Extended Release 24 Hour;  Therapy: (Recorded:12Jan2015) to Recorded  8. Metoprolol Succinate ER 50 MG Oral Tablet Extended Release 24 Hour;  Therapy: (Recorded:12Jan2015) to Recorded  9. Multaq 400 MG Oral Tablet;  Therapy: (Recorded:12Jan2015) to Recorded  10. Niaspan 1000 MG Oral Tablet Extended Release;   Therapy: (Recorded:12Jan2015) to Recorded  Allergies Medication   1. No Known Drug Allergies  Family  History  Negative   Social History Problems    Caffeine use (V49.89)   No alcohol use   Non-smoker (V49.89)   Number of children   Occupation  Review of Systems Genitourinary, constitutional, skin, eye, otolaryngeal, hematologic/lymphatic, cardiovascular, pulmonary, endocrine, musculoskeletal, gastrointestinal, neurological and psychiatric system(s) were reviewed and pertinent findings if present are noted.  Genitourinary: urinary frequency, feelings of urinary urgency, dysuria, nocturia and weak urinary stream.    Vitals Vital Signs [Data Includes: Last 1 Day]  Recorded: 12Jan2015 12:54PM  Height: 5 ft 10 in Weight: 260 lb  BMI Calculated: 37.31 BSA Calculated: 2.33 Blood Pressure: 134 / 87 Temperature: 97 F Heart Rate: 61  Physical Exam Constitutional: Well nourished and well developed . No acute distress.  ENT:. The ears and nose are normal in appearance.  Neck: The appearance of the neck is normal and no neck mass is present.  Pulmonary: No respiratory distress and normal respiratory rhythm and effort.  Cardiovascular: Heart rate and rhythm are normal . No peripheral edema.  Abdomen: The abdomen is soft and nontender. No masses are palpated. No CVA tenderness. No hernias are palpable. No hepatosplenomegaly noted.  Lymphatics: The supraclavicular nodes are not enlarged or tender.  Skin: Normal skin turgor, no visible rash and no visible skin lesions.  Neuro/Psych:. Mood and affect are appropriate.    Results/Data Urine [Data Includes: Last 1 Day]   [id number]  COLOR YELLOW   APPEARANCE CLEAR   SPECIFIC GRAVITY 1.020   pH 6.5   GLUCOSE  NEG mg/dL  BILIRUBIN NEG   KETONE NEG mg/dL  BLOOD NEG   PROTEIN NEG mg/dL  UROBILINOGEN 0.2 mg/dL  NITRITE NEG   LEUKOCYTE ESTERASE NEG    The following images/tracing/specimen were independently visualized:  I have reviewed his CT films and the stone is over 500HU at the UVJ with moderate hydro. KUB today shows a 67mm LUVJ  stone. THere are no renal stones. He has some lumbar spurring but it is mild.  The following clinical lab reports were reviewed:  UA reviewed.    Assessment Assessed   1. Calculus of left ureter (592.1)   He has a large left UVJ stone but is not hurting now.   Plan Calculus of left ureter   1. Follow-up Schedule Surgery Office  Follow-up  Status: Complete  Done: [id number]  2. KUB; Status:Resulted - Requires Verification;   Done: [id number] 12:00AM Health Maintenance   3. UA With REFLEX; [Do Not Release]; Status:Resulted - Requires Verification;   Done:  [id number] 12:46PM    I discussed the options for treatment including medical expulsive therapy with Rapaflo, Ureteroscopy or ESWL.    I am going to start him on Rapaflo and he will be set up for ESWL in 1 week in case he doesn't pass it by then. I reviewed the risks of ESWL including bleeding, infection, failure of fragmentation with need for secondary procedures and sedation risks.    He was given Rapaflo 8mg  po qDay #14.   I will get him cleared by cardiology to come off plavix and ASA for the ESWL.   Discussion/Summary  CC: Dr. Wende Neighbors and Dr. Jenkins Rouge.

## 2013-10-02 ENCOUNTER — Ambulatory Visit: Payer: BC Managed Care – PPO | Admitting: Cardiology

## 2013-10-26 ENCOUNTER — Ambulatory Visit: Payer: BC Managed Care – PPO | Admitting: Cardiovascular Disease

## 2013-11-05 ENCOUNTER — Other Ambulatory Visit: Payer: Self-pay | Admitting: *Deleted

## 2013-11-05 MED ORDER — CLOPIDOGREL BISULFATE 75 MG PO TABS: 75.0000 mg | ORAL_TABLET | Freq: Every day | ORAL | Status: DC

## 2013-11-05 NOTE — Telephone Encounter (Signed)
Plavix refilled electronically with message needs an appointment for future refills.

## 2013-12-24 ENCOUNTER — Telehealth: Payer: Self-pay | Admitting: Cardiology

## 2013-12-24 MED ORDER — DRONEDARONE HCL 400 MG PO TABS: 400.0000 mg | ORAL_TABLET | Freq: Two times a day (BID) | ORAL | Status: DC

## 2013-12-24 NOTE — Telephone Encounter (Signed)
Per note,should se Dr.ishan in July 2015,refilled Multaq 400 mg bid

## 2013-12-24 NOTE — Telephone Encounter (Signed)
Received fax refill request  Rx # [id number] Medication:  Multaq 400 mg tablet Qty 60 Sig:  Take one tablet by mouth twice daily Physician:  Harl Bowie

## 2014-01-08 ENCOUNTER — Other Ambulatory Visit: Payer: Self-pay | Admitting: *Deleted

## 2014-02-07 ENCOUNTER — Other Ambulatory Visit: Payer: Self-pay | Admitting: Cardiology

## 2014-02-07 NOTE — Telephone Encounter (Signed)
Rx was sent to pharmacy electronically. Refills should be deferred to Dr. Edmonia James

## 2014-03-01 ENCOUNTER — Ambulatory Visit (INDEPENDENT_AMBULATORY_CARE_PROVIDER_SITE_OTHER): Payer: BC Managed Care – PPO | Admitting: Urology

## 2014-03-01 DIAGNOSIS — N200 Calculus of kidney: Secondary | ICD-10-CM

## 2014-04-05 ENCOUNTER — Other Ambulatory Visit: Payer: Self-pay | Admitting: *Deleted

## 2014-04-25 ENCOUNTER — Other Ambulatory Visit: Payer: Self-pay

## 2014-04-25 MED ORDER — CLOPIDOGREL BISULFATE 75 MG PO TABS: ORAL_TABLET | ORAL | Status: DC

## 2014-04-29 ENCOUNTER — Telehealth: Payer: Self-pay | Admitting: Cardiology

## 2014-04-29 MED ORDER — DRONEDARONE HCL 400 MG PO TABS: 400.0000 mg | ORAL_TABLET | Freq: Two times a day (BID) | ORAL | Status: DC

## 2014-04-29 NOTE — Telephone Encounter (Signed)
We do not refill meds for Dr Harl Bowie yet so this has to be refill by his RN/CMA

## 2014-04-29 NOTE — Telephone Encounter (Signed)
FYI :This pt is not going to be seeing Dr.Branch,he has a follow up with Dr.Nishan in [place] 06/10/14  I will refill for you

## 2014-04-29 NOTE — Telephone Encounter (Signed)
Received fax refill request  Rx # [id number] Medication:  Multaq 400 mg tablet Qty 60 Sig:  Take one tablet by mouth twice daily Physician:  Harl Bowie

## 2014-05-10 ENCOUNTER — Other Ambulatory Visit: Payer: Self-pay | Admitting: *Deleted

## 2014-05-10 MED ORDER — METOPROLOL SUCCINATE ER 50 MG PO TB24: ORAL_TABLET | ORAL | Status: DC

## 2014-05-31 ENCOUNTER — Other Ambulatory Visit: Payer: Self-pay | Admitting: *Deleted

## 2014-05-31 MED ORDER — CLOPIDOGREL BISULFATE 75 MG PO TABS: ORAL_TABLET | ORAL | Status: DC

## 2014-06-10 ENCOUNTER — Ambulatory Visit (INDEPENDENT_AMBULATORY_CARE_PROVIDER_SITE_OTHER): Payer: BC Managed Care – PPO | Admitting: Cardiovascular Disease

## 2014-06-10 ENCOUNTER — Encounter: Payer: Self-pay | Admitting: Cardiovascular Disease

## 2014-06-10 VITALS — BP 138/82 | HR 68 | Ht 72.0 in | Wt 254.1 lb

## 2014-06-10 DIAGNOSIS — R002 Palpitations: Secondary | ICD-10-CM

## 2014-06-10 DIAGNOSIS — E1142 Type 2 diabetes mellitus with diabetic polyneuropathy: Secondary | ICD-10-CM

## 2014-06-10 DIAGNOSIS — R079 Chest pain, unspecified: Secondary | ICD-10-CM

## 2014-06-10 DIAGNOSIS — E1140 Type 2 diabetes mellitus with diabetic neuropathy, unspecified: Secondary | ICD-10-CM

## 2014-06-10 DIAGNOSIS — I2510 Atherosclerotic heart disease of native coronary artery without angina pectoris: Secondary | ICD-10-CM

## 2014-06-10 DIAGNOSIS — I25119 Atherosclerotic heart disease of native coronary artery with unspecified angina pectoris: Secondary | ICD-10-CM

## 2014-06-10 DIAGNOSIS — I483 Typical atrial flutter: Secondary | ICD-10-CM

## 2014-06-10 DIAGNOSIS — E1149 Type 2 diabetes mellitus with other diabetic neurological complication: Secondary | ICD-10-CM

## 2014-06-10 DIAGNOSIS — I209 Angina pectoris, unspecified: Secondary | ICD-10-CM

## 2014-06-10 DIAGNOSIS — I4892 Unspecified atrial flutter: Secondary | ICD-10-CM

## 2014-06-10 DIAGNOSIS — I10 Essential (primary) hypertension: Secondary | ICD-10-CM

## 2014-06-10 DIAGNOSIS — E785 Hyperlipidemia, unspecified: Secondary | ICD-10-CM

## 2014-06-10 MED ORDER — NITROGLYCERIN 0.4 MG SL SUBL: 0.4000 mg | SUBLINGUAL_TABLET | SUBLINGUAL | Status: DC | PRN

## 2014-06-10 NOTE — Assessment & Plan Note (Signed)
Needs event monitor  Toprol increased by Dr Nevada Crane to 100 mg  Continue multaq for now

## 2014-06-10 NOTE — Progress Notes (Signed)
Patient ID: [name], male   DOB: 1960/12/25, 53 y.o.   MRN: [id number] 53 year old male with a prior history of coronary artery disease, paroxysmal atrial flutter,hypertension, hyperlipidemia, diabetes mellitus, and nephrolithiasis.  New to me.  Last seen by PA Ignacia Bayley in January before lithotripsy.   He suffered a non-ST segment elevation myocardial infarction in May 2006, and underwent successful PCI and drug loading stent placement to the mid right coronary artery. In 2012, he developed palpitations and was found to have atrial flutter. He was initially placed on diltiazem and Coumadin therapy and scheduled for a TEE and cardioversion however the patient spontaneously converted to sinus rhythm. At some point, his Coumadin was discontinued and he was instead placed on multaq, , which he remains on today.  He has been on chronic aspirin and Plavix therapy since his MI in 2006. 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        ROS: Denies fever, malais, weight loss, blurry vision, decreased visual acuity, cough, sputum, SOB, hemoptysis, pleuritic pain, palpitaitons, heartburn, abdominal pain, melena, lower extremity edema, claudication, or rash.  All other systems reviewed and negative  General: Affect appropriate Healthy:  appears stated age 53: normal Neck supple with no adenopathy JVP normal no bruits no thyromegaly Lungs clear with no wheezing and good diaphragmatic motion Heart:  S1/S2 no murmur, no rub, gallop or click PMI normal Abdomen: benighn, BS positve, no tenderness, no AAA no bruit.  No HSM or HJR Distal pulses intact with no bruits No edema Neuro non-focal Skin warm and dry No muscular weakness   Current  Outpatient Prescriptions  Medication Sig Dispense Refill  . aspirin 81 MG tablet Take 81 mg by mouth daily.      . Cinnamon 500 MG TABS Take 500-1,000 mg by mouth 2 (two) times daily.       . clopidogrel (PLAVIX) 75 MG tablet TAKE ONE TABLET BY MOUTH ONCE DAILY WITH BREAKFAST.  30 tablet  0  . CRESTOR 20 MG tablet Take 20 mg by mouth daily.       [name] dronedarone (MULTAQ) 400 MG tablet Take 1 tablet (400 mg total) by mouth 2 (two) times daily with a meal.  60 tablet  3  . KOMBIGLYZE XR 01-999 MG TB24 Take 1 tablet by mouth daily with breakfast.       . metoprolol succinate (TOPROL-XL) 100 MG 24 hr tablet Take 100 mg by mouth 2 (two) times daily. Take with or immediately following a meal.      . niacin (NIASPAN) 1000 MG CR tablet Take 1,000 mg by mouth at bedtime.       . Omega-3 Fatty Acids (FISH OIL) 1200 MG CAPS Take 1 capsule by mouth 2 (two) times daily.      . metoprolol succinate (TOPROL-XL) 50 MG 24 hr tablet Take one and one half tablet daily.       No current facility-administered medications for this visit.    Allergies  Review of patient's allergies indicates no known allergies.  Electrocardiogram:  SR rate 55 normal QT 420  Assessment and Plan

## 2014-06-10 NOTE — Assessment & Plan Note (Signed)
Well controlled.  Continue current medications and low sodium Dash type diet.    

## 2014-06-10 NOTE — Patient Instructions (Addendum)
Your physician recommends that you schedule a follow-up appointment in: [place]  Your physician recommends that you continue on your current medications as directed. Please refer to the Current Medication list given to you today.  Your physician has recommended that you wear an event monitor. Event monitors are medical devices that record the heart's electrical activity. Doctors most often [place] these monitors to diagnose arrhythmias. Arrhythmias are problems with the speed or rhythm of the heartbeat. The monitor is a small, portable device. You can wear one while you do your normal daily activities. This is usually used to diagnose what is causing palpitations/syncope (passing out).   Your physician has requested that you have a lexiscan myoview. For further information please visit [email or web address]. Please follow instruction sheet, as given.

## 2014-06-10 NOTE — Assessment & Plan Note (Signed)
Discussed low carb diet.  Target hemoglobin A1c is 6.5 or less.  Continue current medications.  

## 2014-06-10 NOTE — Assessment & Plan Note (Signed)
Cholesterol is at goal.  Continue current dose of statin and diet Rx.  No myalgias or side effects.  F/U  LFT's in 6 months. Lab Results  Component Value Date   LDLCALC 34 01/24/2013   Labs with Dr Nevada Crane

## 2014-06-10 NOTE — Assessment & Plan Note (Signed)
Symptoms prior to PCI were similar with fluttering in chest.  Cannot walk on treadmill  Needs new nitro f/u lexiscan myovue

## 2014-06-11 ENCOUNTER — Encounter (INDEPENDENT_AMBULATORY_CARE_PROVIDER_SITE_OTHER): Payer: BC Managed Care – PPO

## 2014-06-11 ENCOUNTER — Encounter: Payer: Self-pay | Admitting: *Deleted

## 2014-06-11 ENCOUNTER — Ambulatory Visit (HOSPITAL_COMMUNITY): Payer: BC Managed Care – PPO | Attending: Cardiology | Admitting: Radiology

## 2014-06-11 VITALS — BP 143/88 | HR 58 | Ht 72.0 in | Wt 252.0 lb

## 2014-06-11 DIAGNOSIS — Z9889 Other specified postprocedural states: Secondary | ICD-10-CM | POA: Diagnosis not present

## 2014-06-11 DIAGNOSIS — E119 Type 2 diabetes mellitus without complications: Secondary | ICD-10-CM | POA: Insufficient documentation

## 2014-06-11 DIAGNOSIS — R079 Chest pain, unspecified: Secondary | ICD-10-CM

## 2014-06-11 DIAGNOSIS — R0789 Other chest pain: Secondary | ICD-10-CM | POA: Insufficient documentation

## 2014-06-11 DIAGNOSIS — R002 Palpitations: Secondary | ICD-10-CM | POA: Diagnosis not present

## 2014-06-11 DIAGNOSIS — I10 Essential (primary) hypertension: Secondary | ICD-10-CM | POA: Insufficient documentation

## 2014-06-11 DIAGNOSIS — E785 Hyperlipidemia, unspecified: Secondary | ICD-10-CM | POA: Diagnosis not present

## 2014-06-11 DIAGNOSIS — I4891 Unspecified atrial fibrillation: Secondary | ICD-10-CM | POA: Insufficient documentation

## 2014-06-11 DIAGNOSIS — I2510 Atherosclerotic heart disease of native coronary artery without angina pectoris: Secondary | ICD-10-CM | POA: Diagnosis not present

## 2014-06-11 MED ORDER — TECHNETIUM TC 99M SESTAMIBI GENERIC - CARDIOLITE
10.0000 | Freq: Once | INTRAVENOUS | Status: AC | PRN
  Administered 2014-06-11: 10 via INTRAVENOUS

## 2014-06-11 MED ORDER — TECHNETIUM TC 99M SESTAMIBI GENERIC - CARDIOLITE
30.0000 | Freq: Once | INTRAVENOUS | Status: AC | PRN
  Administered 2014-06-11: 30 via INTRAVENOUS

## 2014-06-11 MED ORDER — REGADENOSON 0.4 MG/5ML IV SOLN
0.4000 mg | Freq: Once | INTRAVENOUS | Status: AC
  Administered 2014-06-11: 0.4 mg via INTRAVENOUS

## 2014-06-11 NOTE — Progress Notes (Signed)
[facility] [address] [place], [place] [address] [phone]    Cardiology Nuclear Med Study  [name] is a 53 y.o. male     MRN : [id number]     DOB: Jan 29, 1961  Procedure Date: 06/11/2014  Nuclear Med Background Indication for Stress Test:  Evaluation for Ischemia and Stent Patency History:  CAD, MPI ~2 yrs ago (ok per pt.), afib Cardiac Risk Factors: Hypertension and NIDDM  Symptoms:  Chest Tightness and Palpitations   Nuclear Pre-Procedure Caffeine/Decaff Intake:  None NPO After: 7:00pm   Lungs:  clear O2 Sat: 95% on room air. IV 0.9% NS with Angio Cath:  22g  IV Site: R Antecubital x 1, tolerated well IV Started by:  Irven Baltimore, RN  Chest Size (in):  46 Cup Size: n/a  Height: 6' (1.829 m)  Weight:  252 lb (114.306 kg)  BMI:  Body mass index is 34.17 kg/(m^2). Tech Comments:  Patient took Toprol, but no diabetic po medication this am. Irven Baltimore, RN.    Nuclear Med Study 1 or 2 day study: 1 day  Stress Test Type:  Treadmill/Lexiscan  Reading MD: N/A  Order Authorizing Provider:  Jenkins Rouge, MD  Resting Radionuclide: Technetium 4m Sestamibi  Resting Radionuclide Dose: 11.0 mCi   Stress Radionuclide:  Technetium 2m Sestamibi  Stress Radionuclide Dose: 33.0 mCi           Stress Protocol Rest HR: 58 Stress HR: 93  Rest BP: 143/88 Stress BP: 106/67  Exercise Time (min): n/a METS: n/a   Predicted Max HR: 170 bpm % Max HR: 54.71 bpm Rate Pressure Product: 14322   Dose of Adenosine (mg):  n/a Dose of Lexiscan: 0.4 mg  Dose of Atropine (mg): n/a Dose of Dobutamine: n/a mcg/kg/min (at max HR)  Stress Test Technologist: Glade Lloyd, BS-ES  Nuclear Technologist:  Earl Many, CNMT     Rest Procedure:  Myocardial perfusion imaging was performed at rest 45 minutes following the intravenous administration of Technetium 37m Sestamibi. Rest ECG: NSR - Normal EKG  Stress Procedure:  The patient received IV Lexiscan 0.4  mg over 15-seconds with concurrent low level exercise and then Technetium 2m Sestamibi was injected at 30-seconds while the patient continued walking one more minute.  Quantitative spect images were obtained after a 45-minute delay.  During the infusion of Lexiscan the patient complained of feeling weird which resolved in recovery.  Stress ECG: There are scattered PVCs.  QPS Raw Data Images:  Normal; no motion artifact; normal heart/lung ratio. Stress Images:  There is decreased uptake in the inferior wall. Rest Images:  Normal homogeneous uptake in all areas of the myocardium. Subtraction (SDS):  Basal inferior reversibility (SDS 4) Transient Ischemic Dilatation (Normal <1.22):  1.07 Lung/Heart Ratio (Normal <0.45):  0.36  Quantitative Gated Spect Images QGS EDV:  141 ml QGS ESV:  72 ml  Impression Exercise Capacity:  Lexiscan with low level exercise. BP Response:  Hypotensive blood pressure response. Clinical Symptoms:  No significant symptoms noted. ECG Impression:  There are scattered PVCs. Comparison with Prior Nuclear Study: No images to compare  Overall Impression:  Intermediate risk stress nuclear study with small sized and moderate severity reversibly basal inferior defect suggestive of ischemia.  LV Ejection Fraction: 49%.  LV Wall Motion:  Mild basal inferior hypokinesis  Pixie Casino, MD, [facility] Board Certified in Nuclear Cardiology Attending Cardiologist [place]

## 2014-06-11 NOTE — Progress Notes (Signed)
Patient ID: [name], male   DOB: 1961/09/02, 53 y.o.   MRN: [id number] [employer] 30 day cardiac event monitor applied to patient.

## 2014-06-13 ENCOUNTER — Encounter: Payer: Self-pay | Admitting: *Deleted

## 2014-06-13 ENCOUNTER — Other Ambulatory Visit: Payer: Self-pay | Admitting: *Deleted

## 2014-06-13 DIAGNOSIS — Z01810 Encounter for preprocedural cardiovascular examination: Secondary | ICD-10-CM

## 2014-06-17 ENCOUNTER — Other Ambulatory Visit (INDEPENDENT_AMBULATORY_CARE_PROVIDER_SITE_OTHER): Payer: BC Managed Care – PPO | Admitting: *Deleted

## 2014-06-17 DIAGNOSIS — Z01810 Encounter for preprocedural cardiovascular examination: Secondary | ICD-10-CM

## 2014-06-17 LAB — CBC WITH DIFFERENTIAL/PLATELET
BASOS PCT: 0.5 % (ref 0.0–3.0)
Basophils Absolute: 0 10*3/uL (ref 0.0–0.1)
Eosinophils Absolute: 0.4 10*3/uL (ref 0.0–0.7)
Eosinophils Relative: 5.8 % — ABNORMAL HIGH (ref 0.0–5.0)
HCT: 41 % (ref 39.0–52.0)
HEMOGLOBIN: 13.8 g/dL (ref 13.0–17.0)
Lymphocytes Relative: 31.5 % (ref 12.0–46.0)
Lymphs Abs: 2 10*3/uL (ref 0.7–4.0)
MCHC: 33.7 g/dL (ref 30.0–36.0)
MCV: 91.5 fl (ref 78.0–100.0)
Monocytes Absolute: 0.7 10*3/uL (ref 0.1–1.0)
Monocytes Relative: 10.8 % (ref 3.0–12.0)
NEUTROS ABS: 3.3 10*3/uL (ref 1.4–7.7)
Neutrophils Relative %: 51.4 % (ref 43.0–77.0)
Platelets: 187 10*3/uL (ref 150.0–400.0)
RBC: 4.48 Mil/uL (ref 4.22–5.81)
RDW: 13.6 % (ref 11.5–15.5)
WBC: 6.4 10*3/uL (ref 4.0–10.5)

## 2014-06-17 LAB — BASIC METABOLIC PANEL
BUN: 16 mg/dL (ref 6–23)
CHLORIDE: 106 meq/L (ref 96–112)
CO2: 24 meq/L (ref 19–32)
CREATININE: 1 mg/dL (ref 0.4–1.5)
Calcium: 8.6 mg/dL (ref 8.4–10.5)
GFR: 83.8 mL/min (ref >60.00)
Glucose, Bld: 112 mg/dL — ABNORMAL HIGH (ref 70–99)
POTASSIUM: 4.2 meq/L (ref 3.5–5.1)
Sodium: 138 mEq/L (ref 135–145)

## 2014-06-17 LAB — PROTIME-INR
INR: 1 ratio (ref 0.8–1.0)
PROTHROMBIN TIME: 11 s (ref 9.6–13.1)

## 2014-06-21 ENCOUNTER — Encounter (HOSPITAL_COMMUNITY): Payer: Self-pay | Admitting: Pharmacy Technician

## 2014-06-26 ENCOUNTER — Other Ambulatory Visit: Payer: Self-pay | Admitting: Cardiovascular Disease

## 2014-06-26 DIAGNOSIS — I2584 Coronary atherosclerosis due to calcified coronary lesion: Principal | ICD-10-CM

## 2014-06-26 DIAGNOSIS — I2510 Atherosclerotic heart disease of native coronary artery without angina pectoris: Secondary | ICD-10-CM

## 2014-06-27 ENCOUNTER — Encounter (HOSPITAL_COMMUNITY)
Admission: RE | Disposition: A | Discharge status: Home / Self Care | Payer: Self-pay | Source: Ambulatory Visit | Attending: Cardiovascular Disease

## 2014-06-27 ENCOUNTER — Ambulatory Visit (HOSPITAL_COMMUNITY)
Admission: RE | Admit: 2014-06-27 | Discharge: 2014-06-27 | Disposition: A | Discharge status: Home / Self Care | Payer: BC Managed Care – PPO | Source: Ambulatory Visit | Attending: Cardiovascular Disease | Admitting: Cardiovascular Disease

## 2014-06-27 DIAGNOSIS — T82858A Stenosis of vascular prosthetic devices, implants and grafts, initial encounter: Secondary | ICD-10-CM | POA: Insufficient documentation

## 2014-06-27 DIAGNOSIS — I10 Essential (primary) hypertension: Secondary | ICD-10-CM | POA: Insufficient documentation

## 2014-06-27 DIAGNOSIS — I2510 Atherosclerotic heart disease of native coronary artery without angina pectoris: Secondary | ICD-10-CM | POA: Insufficient documentation

## 2014-06-27 DIAGNOSIS — I252 Old myocardial infarction: Secondary | ICD-10-CM | POA: Diagnosis not present

## 2014-06-27 DIAGNOSIS — E119 Type 2 diabetes mellitus without complications: Secondary | ICD-10-CM | POA: Diagnosis not present

## 2014-06-27 DIAGNOSIS — Z7982 Long term (current) use of aspirin: Secondary | ICD-10-CM | POA: Diagnosis not present

## 2014-06-27 DIAGNOSIS — Z955 Presence of coronary angioplasty implant and graft: Secondary | ICD-10-CM | POA: Diagnosis not present

## 2014-06-27 DIAGNOSIS — Z7902 Long term (current) use of antithrombotics/antiplatelets: Secondary | ICD-10-CM | POA: Insufficient documentation

## 2014-06-27 DIAGNOSIS — E785 Hyperlipidemia, unspecified: Secondary | ICD-10-CM | POA: Diagnosis not present

## 2014-06-27 DIAGNOSIS — I2584 Coronary atherosclerosis due to calcified coronary lesion: Secondary | ICD-10-CM

## 2014-06-27 HISTORY — PX: LEFT HEART CATHETERIZATION WITH CORONARY ANGIOGRAM: SHX5451

## 2014-06-27 LAB — GLUCOSE, CAPILLARY: Glucose-Capillary: 128 mg/dL — ABNORMAL HIGH (ref 70–99)

## 2014-06-27 SURGERY — LEFT HEART CATHETERIZATION WITH CORONARY ANGIOGRAM
Anesthesia: LOCAL

## 2014-06-27 MED ORDER — VERAPAMIL HCL 2.5 MG/ML IV SOLN
INTRAVENOUS | Status: AC
  Filled 2014-06-27: qty 2

## 2014-06-27 MED ORDER — ASPIRIN 81 MG PO CHEW: 81.0000 mg | CHEWABLE_TABLET | ORAL | Status: DC

## 2014-06-27 MED ORDER — SODIUM CHLORIDE 0.45 % IV SOLN
INTRAVENOUS | Status: DC
  Administered 2014-06-27: 11:00:00 via INTRAVENOUS

## 2014-06-27 MED ORDER — OXYCODONE-ACETAMINOPHEN 5-325 MG PO TABS: 1.0000 | ORAL_TABLET | ORAL | Status: DC | PRN

## 2014-06-27 MED ORDER — HEPARIN (PORCINE) IN NACL 2-0.9 UNIT/ML-% IJ SOLN
INTRAMUSCULAR | Status: AC
  Filled 2014-06-27: qty 1500

## 2014-06-27 MED ORDER — SODIUM CHLORIDE 0.9 % IJ SOLN: 3.0000 mL | INTRAMUSCULAR | Status: DC | PRN

## 2014-06-27 MED ORDER — HEPARIN SODIUM (PORCINE) 1000 UNIT/ML IJ SOLN
INTRAMUSCULAR | Status: AC
  Filled 2014-06-27: qty 1

## 2014-06-27 MED ORDER — SODIUM CHLORIDE 0.9 % IJ SOLN: 3.0000 mL | Freq: Two times a day (BID) | INTRAMUSCULAR | Status: DC

## 2014-06-27 MED ORDER — MIDAZOLAM HCL 2 MG/2ML IJ SOLN
INTRAMUSCULAR | Status: AC
  Filled 2014-06-27: qty 2

## 2014-06-27 MED ORDER — FENTANYL CITRATE 0.05 MG/ML IJ SOLN
INTRAMUSCULAR | Status: AC
  Filled 2014-06-27: qty 2

## 2014-06-27 MED ORDER — SODIUM CHLORIDE 0.9 % IV SOLN: 250.0000 mL | INTRAVENOUS | Status: DC | PRN

## 2014-06-27 MED ORDER — DIAZEPAM 2 MG PO TABS: 2.0000 mg | ORAL_TABLET | Freq: Four times a day (QID) | ORAL | Status: DC | PRN

## 2014-06-27 MED ORDER — ASPIRIN 81 MG PO CHEW
CHEWABLE_TABLET | ORAL | Status: AC
  Administered 2014-06-27: 09:00:00
  Filled 2014-06-27: qty 1

## 2014-06-27 MED ORDER — LIDOCAINE HCL (PF) 1 % IJ SOLN
INTRAMUSCULAR | Status: AC
  Filled 2014-06-27: qty 30

## 2014-06-27 MED ORDER — NITROGLYCERIN 1 MG/10 ML FOR IR/CATH LAB
INTRA_ARTERIAL | Status: AC
  Filled 2014-06-27: qty 10

## 2014-06-27 NOTE — CV Procedure (Signed)
    Cardiac Catheterization Procedure Note  Name: [name] MRN: [id number] DOB: [birth year]  Procedure: Left Heart Cath, Selective Coronary Angiography, LV angiography  Indication:  Chest Pain Abnormal myovue   Procedural Details: The right wrist was prepped, draped, and anesthetized with 1% lidocaine. Using the modified Seldinger technique, a 5/6 French Slender sheath was introduced into the right radial artery. 3 mg of verapamil was administered through the sheath, weight-based unfractionated heparin was administered intravenously. Standard Judkins catheters were used for selective coronary angiography and left ventriculography. Catheter exchanges were performed over an exchange length guidewire. There were no immediate procedural complications. A TR band was used for radial hemostasis at the completion of the procedure.  The patient was transferred to the post catheterization recovery area for further monitoring.  Procedural Findings: Hemodynamics: AO  110 66 LV  105 6   Coronary angiography: Coronary dominance: right  Left mainstem:  Normal  Left anterior descending (LAD):  Normal    D1: Normal  D2 Small and normal  Left circumflex (LCx):  Codominant  Normal   OM1: normal  OM2: normal  OM3 normal  Right coronary artery (RCA):  30% tubular proximal  30% diffuse instent restenosis in mid vessel     PDA:  Small and normal  PLA:  Small and normal  Left ventriculography: Left ventricular systolic function is normal, LVEF is estimated at 55-65%, there is no significant mitral regurgitation     Recommendations:  No stenotic lesions Stent patent  No new left sided lesions  Continue medical Rx  Jenkins Rouge MD, [facility] 06/27/2014, 11:00 AM

## 2014-06-27 NOTE — H&P (View-Only) (Signed)
Patient ID: [name], male   DOB: 1961/02/04, 53 y.o.   MRN: [id number] 53 year old male with a prior history of coronary artery disease, paroxysmal atrial flutter,hypertension, hyperlipidemia, diabetes mellitus, and nephrolithiasis.  New to me.  Last seen by PA Ignacia Bayley in January before lithotripsy.   He suffered a non-ST segment elevation myocardial infarction in May 2006, and underwent successful PCI and drug loading stent placement to the mid right coronary artery. In 2012, he developed palpitations and was found to have atrial flutter. He was initially placed on diltiazem and Coumadin therapy and scheduled for a TEE and cardioversion however the patient spontaneously converted to sinus rhythm. At some point, his Coumadin was discontinued and he was instead placed on multaq, , which he remains on today.  He has been on chronic aspirin and Plavix therapy since his MI in 2006. 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        ROS: Denies fever, malais, weight loss, blurry vision, decreased visual acuity, cough, sputum, SOB, hemoptysis, pleuritic pain, palpitaitons, heartburn, abdominal pain, melena, lower extremity edema, claudication, or rash.  All other systems reviewed and negative  General: Affect appropriate Healthy:  appears stated age 53: normal Neck supple with no adenopathy JVP normal no bruits no thyromegaly Lungs clear with no wheezing and good diaphragmatic motion Heart:  S1/S2 no murmur, no rub, gallop or click PMI normal Abdomen: benighn, BS positve, no tenderness, no AAA no bruit.  No HSM or HJR Distal pulses intact with no bruits No edema Neuro non-focal Skin warm and dry No muscular weakness   Current  Outpatient Prescriptions  Medication Sig Dispense Refill  . aspirin 81 MG tablet Take 81 mg by mouth daily.      . Cinnamon 500 MG TABS Take 500-1,000 mg by mouth 2 (two) times daily.       . clopidogrel (PLAVIX) 75 MG tablet TAKE ONE TABLET BY MOUTH ONCE DAILY WITH BREAKFAST.  30 tablet  0  . CRESTOR 20 MG tablet Take 20 mg by mouth daily.       [name] dronedarone (MULTAQ) 400 MG tablet Take 1 tablet (400 mg total) by mouth 2 (two) times daily with a meal.  60 tablet  3  . KOMBIGLYZE XR 01-999 MG TB24 Take 1 tablet by mouth daily with breakfast.       . metoprolol succinate (TOPROL-XL) 100 MG 24 hr tablet Take 100 mg by mouth 2 (two) times daily. Take with or immediately following a meal.      . niacin (NIASPAN) 1000 MG CR tablet Take 1,000 mg by mouth at bedtime.       . Omega-3 Fatty Acids (FISH OIL) 1200 MG CAPS Take 1 capsule by mouth 2 (two) times daily.      . metoprolol succinate (TOPROL-XL) 50 MG 24 hr tablet Take one and one half tablet daily.       No current facility-administered medications for this visit.    Allergies  Review of patient's allergies indicates no known allergies.  Electrocardiogram:  SR rate 55 normal QT 420  Assessment and Plan

## 2014-06-27 NOTE — Discharge Instructions (Addendum)
Atherosclerosis Atherosclerosis, or hardening of the arteries, is the buildup of plaque within the major arteries in the body. Plaque is made up of fats (lipids), cholesterol, calcium, and fibrous tissue. Plaque can narrow or block blood flow within an artery. Plaque can break off and cause damage to the affected organ. Plaque can also "rupture." When plaque ruptures within an artery, a clot can form, causing a sudden (acute) blockage of the artery. Untreated atherosclerosis can cause serious health problems or death.  RISK FACTORS  High cholesterol levels.  Smoking.  Obesity.  Lack of activity or exercise.  Eating a diet high in saturated fat.  Family history.  Diabetes. SIGNS AND SYMPTOMS  Symptoms of atherosclerosis can occur when blood flow to an artery is slowed or blocked. Severity and onset of symptoms depends on how extensive the narrowing or blockage is. A sudden plaque rupture can bring immediate, life-threatening symptoms. Atherosclerosis can affect different arteries in the body, for example:  Coronary arteries. The coronary arteries supply the heart with blood. When the coronary arteries are narrowed or blocked from atherosclerosis, this is known as coronary artery disease (CAD). CAD can cause a heart attack. Common heart attack symptoms include:  Chest pain or pain that radiates to the neck, arm, jaw, or in the upper, middle back (mid-scapular pain).  Shortness of breath without cause.  Profuse sweating while at rest.  Irregular heartbeats.  Nausea or gastrointestinal upset.  Carotid arteries. The carotid arteries supply the brain with blood. They are located on each side of your neck. When blood flow to these arteries is slowed or blocked, a transient ischemic attack (TIA) or stroke can occur. A TIA is considered a "mini-stroke" or "warning stroke." TIA symptoms are the same as stroke symptoms, but they are temporary and last less than 24 hours. A stroke can cause  permanent damage or death. Common TIA and stroke symptoms include:  Sudden numbness or weakness to one side of your body, such as the face, arm, or leg.  Sudden confusion or trouble speaking or understanding.  Sudden trouble seeing out of one or both eyes.  Sudden trouble walking, loss of balance, or dizziness.  Sudden, severe headache with no known cause.  Arteries in the legs. When arteries in the lower legs become narrowed or blocked, this is known as peripheral vascular disease (PVD). PVD can cause a symptom called claudication. Claudication is pain or a burning feeling in your legs when walking or exercising and usually goes away with rest. Very severe PVD can cause pain in your legs while at rest.  Renal arteries. The renal arteries supply the kidneys with blood. Blockage of the renal arteries can cause a decline in kidney function or high blood pressure (hypertension).  Gastrointestinal arteries (mesenteric circulation). Abdominal pain may occur after eating. DIAGNOSIS  Your health care provider may perform the following tests to diagnose atherosclerosis:  Blood tests.  Stress test.  Echocardiogram.  Nuclear scan.  Ankle/brachial index.  Ultrasonography.  Computed tomography (CT) scan.  Angiography. TREATMENT  Atherosclerosis treatment includes the following:  Lifestyle changes such as:  Quitting smoking. Your health care provider can help you with smoking cessation.  Eating a diet low in saturated fat. A registered dietitian can educate you on healthy food options, such as helping you understand the difference between good fat and bad fat.  Following an exercise program approved by your health care provider.  Maintaining a healthy weight. Lose weight as approved by your health care provider.  Have  your cholesterol levels checked as directed by your health care provider.  Medicines. Cholesterol medicines can help slow or stop the progression of  atherosclerosis.  Different procedural or surgical interventions to treat atherosclerosis include:  Balloon angioplasty. The technical name for balloon angioplasty is percutaneous transluminal angioplasty (PTA). In this procedure, a catheter with a small balloon at the tip is inserted through the blocked or narrowed artery. The balloon is then inflated. When the balloon is inflated, the fatty plaque is compressed against the artery wall, allowing better blood flow within the artery.  Balloon angioplasty and stenting. In this procedure, balloon angioplasty is combined with a stenting procedure. A stent is a small, metal mesh tube that keeps the artery open. After the artery is opened up by the balloon technique, the stent is then deployed. The stent is permanent.  Open heart surgery or bypass surgery. To perform this type of surgery, a healthy vessel is first "harvested" from either the leg or arm. The harvested vessel is then used to "bypass" the blocked atherosclerotic vessel so new blood flow can be established.  Atherectomy. Atherectomy is a procedure that uses a catheter with a sharp blade to remove plaque from an artery. A chamber in the catheter collects the plaque.  Endarterectomy. An endarterectomy is a surgical procedure where a surgeon removes plaque from an artery.  Amputation. When blockages in the lower legs are very severe and circulation cannot be restored, amputation may be required. SEEK IMMEDIATE MEDICAL CARE IF:  You are having heart attack symptoms, such as:  Chest pain or pain that radiates to the neck, arm, jaw, or in the upper, middle back (mid-scapular pain).  Shortness of breath without cause.  Profuse sweating while at rest.  Irregular heartbeats.  Nausea or gastrointestinal upset.  You are having stroke symptoms, such as sudden:  Numbness or weakness to one side of your body, such as the face, arm, or leg.  Confusion or trouble speaking or  understanding.  Trouble seeing out of one or both eyes.  Trouble walking, loss of balance, or dizziness.  Severe headache with no known cause.  Your hands or feet are bluish, cold, or you have pain in them.  You have bad abdominal pain after eating. Symptoms of heart attack or stroke may represent a serious problem that is an emergency. Do not wait to see if the symptoms will go away. Get medical help right away. Call your local emergency services (911 in the [place] Do not drive yourself to the hospital. Document Released: 11/20/2003 Document Revised: 01/14/2014 Document Reviewed: 11/02/2011 [facility] Patient Information 2015 [place], [place]. This information is not intended to replace advice given to you by your health care provider. Make sure you discuss any questions you have with your health care provider.  No work until Monday Office will call for f/u Continue current meds  Radial Site Care Refer to this sheet in the next few weeks. These instructions provide you with information on caring for yourself after your procedure. Your caregiver may also give you more specific instructions. Your treatment has been planned according to current medical practices, but problems sometimes occur. Call your caregiver if you have any problems or questions after your procedure. HOME CARE INSTRUCTIONS  You may shower the day after the procedure.Remove the bandage (dressing) and gently wash the site with plain soap and water.Gently pat the site dry.  Do not apply powder or lotion to the site.  Do not submerge the affected site in water for  3 to 5 days.  Inspect the site at least twice daily.  Do not flex or bend the affected arm for 24 hours.  No lifting over 5 pounds (2.3 kg) for 5 days after your procedure.  Do not drive home if you are discharged the same day of the procedure. Have someone else drive you.  You may drive 24 hours after the procedure unless otherwise instructed by your  caregiver.  Do not operate machinery or power tools for 24 hours.  A responsible adult should be with you for the first 24 hours after you arrive home. What to expect:  Any bruising will usually fade within 1 to 2 weeks.  Blood that collects in the tissue (hematoma) may be painful to the touch. It should usually decrease in size and tenderness within 1 to 2 weeks. SEEK IMMEDIATE MEDICAL CARE IF:  You have unusual pain at the radial site.  You have redness, warmth, swelling, or pain at the radial site.  You have drainage (other than a small amount of blood on the dressing).  You have chills.  You have a fever or persistent symptoms for more than 72 hours.  You have a fever and your symptoms suddenly get worse.  Your arm becomes pale, cool, tingly, or numb.  You have heavy bleeding from the site. Hold pressure on the site and call 911. Document Released: 10/02/2010 Document Revised: 11/22/2011 Document Reviewed: 10/02/2010 [facility] Patient Information 2015 [place], [place]. This information is not intended to replace advice given to you by your health care provider. Make sure you discuss any questions you have with your health care provider.

## 2014-06-27 NOTE — Interval H&P Note (Signed)
History and Physical Interval Note:  06/27/2014 10:16 AM  [name]  has presented today for surgery, with the diagnosis of abnormal mioview  The various methods of treatment have been discussed with the patient and family. After consideration of risks, benefits and other options for treatment, the patient has consented to  Procedure(s): LEFT HEART CATHETERIZATION WITH CORONARY ANGIOGRAM (N/A) as a surgical intervention .  The patient's history has been reviewed, patient examined, no change in status, stable for surgery.  I have reviewed the patient's chart and labs.  Questions were answered to the patient's satisfaction.     Jenkins Rouge

## 2014-07-29 ENCOUNTER — Telehealth: Payer: Self-pay | Admitting: *Deleted

## 2014-07-29 NOTE — Telephone Encounter (Signed)
LMTCB  RE MONITOR RESULTS  PER  DR NISHAN NSR ./CY 

## 2014-07-31 NOTE — Telephone Encounter (Signed)
Follow up  ° ° ° °Returning call back to nurse  °

## 2014-07-31 NOTE — Telephone Encounter (Signed)
Spoke with patient's wife who states a message was left on their home answering machine but she could not understand what the person said.  I reviewed [name] message with patient's wife who verbalized understanding.

## 2014-08-22 ENCOUNTER — Encounter (HOSPITAL_COMMUNITY): Payer: Self-pay | Admitting: Cardiovascular Disease

## 2014-09-05 ENCOUNTER — Other Ambulatory Visit: Payer: Self-pay | Admitting: Cardiovascular Disease

## 2014-09-05 MED ORDER — DRONEDARONE HCL 400 MG PO TABS: 400.0000 mg | ORAL_TABLET | Freq: Two times a day (BID) | ORAL | Status: DC

## 2014-09-11 ENCOUNTER — Other Ambulatory Visit: Payer: Self-pay | Admitting: *Deleted

## 2014-09-11 MED ORDER — CLOPIDOGREL BISULFATE 75 MG PO TABS: 75.0000 mg | ORAL_TABLET | Freq: Every day | ORAL | Status: DC

## 2014-12-25 ENCOUNTER — Other Ambulatory Visit: Payer: Self-pay | Admitting: Cardiovascular Disease

## 2015-01-06 ENCOUNTER — Other Ambulatory Visit: Payer: Self-pay | Admitting: Cardiovascular Disease

## 2015-03-08 ENCOUNTER — Other Ambulatory Visit: Payer: Self-pay | Admitting: Cardiovascular Disease

## 2015-03-10 ENCOUNTER — Other Ambulatory Visit: Payer: Self-pay

## 2015-03-10 MED ORDER — DRONEDARONE HCL 400 MG PO TABS: ORAL_TABLET | ORAL | Status: DC

## 2015-03-26 ENCOUNTER — Other Ambulatory Visit: Payer: Self-pay | Admitting: Cardiovascular Disease

## 2015-04-09 ENCOUNTER — Other Ambulatory Visit: Payer: Self-pay | Admitting: Cardiovascular Disease

## 2015-04-10 ENCOUNTER — Other Ambulatory Visit: Payer: Self-pay

## 2015-04-10 MED ORDER — METOPROLOL SUCCINATE ER 100 MG PO TB24: 100.0000 mg | ORAL_TABLET | Freq: Every day | ORAL | Status: DC

## 2015-04-10 MED ORDER — DRONEDARONE HCL 400 MG PO TABS: ORAL_TABLET | ORAL | Status: DC

## 2015-04-16 ENCOUNTER — Encounter: Payer: Self-pay | Admitting: Cardiovascular Disease

## 2015-05-09 ENCOUNTER — Other Ambulatory Visit: Payer: Self-pay | Admitting: Cardiovascular Disease

## 2015-05-26 ENCOUNTER — Other Ambulatory Visit: Payer: Self-pay | Admitting: Cardiovascular Disease

## 2015-06-22 NOTE — Progress Notes (Signed)
Patient ID: [name], male   DOB: Dec 11, 1960, 54 y.o.   MRN: [id number] 54 y.o. male with a prior history of coronary artery disease, paroxysmal atrial flutter,hypertension, hyperlipidemia, diabetes mellitus, and nephrolithiasis.     He suffered a non-ST segment elevation myocardial infarction in May 2006, and underwent successful PCI and drug loading stent placement to the mid right coronary artery. In 2012, he developed palpitations and was found to have atrial flutter. He was initially placed on diltiazem and Coumadin therapy and scheduled for a TEE and cardioversion however the patient spontaneously converted to sinus rhythm. At some point, his Coumadin was discontinued and he was instead placed on multaq, , which he remains on today.  He has been on chronic aspirin and Plavix therapy since his MI in 2006. 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   ROS: Denies fever, malais, weight loss, blurry vision, decreased visual acuity, cough, sputum, SOB, hemoptysis, pleuritic pain, palpitaitons, heartburn, abdominal pain, melena, lower extremity edema, claudication, or rash.  All other systems reviewed and negative  General: Affect appropriate Healthy:  appears stated age 26: normal Neck supple with no adenopathy JVP normal no bruits no thyromegaly Lungs clear with no wheezing and good diaphragmatic motion Heart:  S1/S2 no murmur, no rub, gallop or click PMI normal Abdomen: benighn, BS positve, no tenderness, no AAA no bruit.  No HSM or HJR Distal pulses intact with no bruits No edema Neuro non-focal Skin warm and dry No muscular weakness   Current Outpatient Prescriptions  Medication Sig Dispense Refill  . aspirin EC 81 MG tablet Take 81 mg by mouth daily.    . cetirizine (ZYRTEC) 10 MG tablet  Take 10 mg by mouth daily as needed for allergies.    . Cinnamon 500 MG TABS Take 1,000 mg by mouth 2 (two) times daily.     . clopidogrel (PLAVIX) 75 MG tablet TAKE ONE TABLET BY MOUTH ONCE DAILY WITH BREAKFAST. 30 tablet 0  . CRESTOR 20 MG tablet Take 20 mg by mouth every evening.     . docusate sodium (COLACE) 100 MG capsule Take 100 mg by mouth 2 (two) times daily as needed for mild constipation.    [name] KOMBIGLYZE XR 01-999 MG TB24 Take 1 tablet by mouth daily with breakfast.     . metoprolol succinate (TOPROL-XL) 100 MG 24 hr tablet Take 1 tablet (100 mg total) by mouth daily. Take with or immediately following a meal. 30 tablet 0  . MULTAQ 400 MG tablet TAKE (1) TABLET BY MOUTH TWICE DAILY WITH A MEAL. 60 tablet 6  . niacin (NIASPAN) 1000 MG CR tablet Take 1,000 mg by mouth at bedtime.     . nitroGLYCERIN (NITROSTAT) 0.4 MG SL tablet Place 1 tablet (0.4 mg total) under the tongue every 5 (five) minutes as needed for chest pain. 25 tablet 3  . Omega-3 Fatty Acids (FISH OIL) 1200 MG CAPS Take 1 capsule by mouth 2 (two) times daily.     No current facility-administered medications for this visit.    Allergies  Review of patient's allergies indicates no known allergies.  Electrocardiogram:  2014  SR rate 55 normal QT 420 06/27/14  SR low voltage in inferior leads   Assessment and Plan CAD:  Stable with no angina and good activity  level.  Continue medical Rx with ASA/Plavix and beta blocker Aflutter:  maint NSR continue multaq Chol:  Continue niacin and statin recent labs with primary W[facility] Neighbors Lab Results  Component Value Date   LDLCALC 34 01/24/2013

## 2015-06-23 ENCOUNTER — Ambulatory Visit (INDEPENDENT_AMBULATORY_CARE_PROVIDER_SITE_OTHER): Payer: BLUE CROSS/BLUE SHIELD | Admitting: Cardiovascular Disease

## 2015-06-23 ENCOUNTER — Encounter: Payer: Self-pay | Admitting: Cardiovascular Disease

## 2015-06-23 VITALS — BP 132/74 | HR 66 | Ht 72.0 in | Wt 259.8 lb

## 2015-06-23 DIAGNOSIS — I10 Essential (primary) hypertension: Secondary | ICD-10-CM

## 2015-06-23 NOTE — Patient Instructions (Signed)
Your physician wants you to follow-up in: 1 YEAR  a reminder letter in the mail two months in advance. If you don't receive a letter, please call our office to schedule the follow-up appointment.    Your physician recommends that you continue on your current medications as directed. Please refer to the Current Medication list given to you today.       Thank you for choosing [facility] !

## 2015-06-27 ENCOUNTER — Other Ambulatory Visit: Payer: Self-pay | Admitting: Cardiovascular Disease

## 2015-10-04 ENCOUNTER — Other Ambulatory Visit: Payer: Self-pay | Admitting: Cardiovascular Disease

## 2015-12-10 ENCOUNTER — Other Ambulatory Visit: Payer: Self-pay | Admitting: Cardiovascular Disease

## 2016-04-28 ENCOUNTER — Telehealth: Payer: Self-pay | Admitting: Cardiovascular Disease

## 2016-04-28 NOTE — Telephone Encounter (Signed)
Don't know of any issues with T-dap and heart meds

## 2016-04-28 NOTE — Telephone Encounter (Signed)
Will forward to Dr.Nishan. 

## 2016-04-29 NOTE — Telephone Encounter (Signed)
Pt made aware

## 2016-06-16 ENCOUNTER — Other Ambulatory Visit: Payer: Self-pay | Admitting: Cardiovascular Disease

## 2016-06-21 NOTE — Progress Notes (Signed)
Patient ID: [name], male   DOB: [birth year], 55 y.o.   MRN: [id number] 55 y.o. male with a prior history of coronary artery disease, paroxysmal atrial flutter,hypertension, hyperlipidemia, diabetes mellitus, and nephrolithiasis.     He suffered a non-ST segment elevation myocardial infarction in May 2006, and underwent successful PCI and drug loading stent placement to the mid right coronary artery. In 2012, he developed palpitations and was found to have atrial flutter. He was initially placed on diltiazem and Coumadin therapy and scheduled for a TEE and cardioversion however the patient spontaneously converted to sinus rhythm. At some point, his Coumadin was discontinued and he was instead placed on multaq, , which he remains on today.  He has been on chronic aspirin and Plavix therapy since his MI in 2006. 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   ROS: Denies fever, malais, weight loss, blurry vision, decreased visual acuity, cough, sputum, SOB, hemoptysis, pleuritic pain, palpitaitons, heartburn, abdominal pain, melena, lower extremity edema, claudication, or rash.  All other systems reviewed and negative  General: Affect appropriate Healthy:  appears stated age 54: normal Neck supple with no adenopathy JVP normal no bruits no thyromegaly Lungs clear with no wheezing and good diaphragmatic motion Heart:  S1/S2 no murmur, no rub, gallop or click PMI normal Abdomen: benighn, BS positve, no tenderness, no AAA no bruit.  No HSM or HJR Distal pulses intact with no bruits No edema Neuro non-focal Skin warm and dry No muscular weakness   Current Outpatient Prescriptions  Medication Sig Dispense Refill  . aspirin EC 81 MG tablet Take 81 mg by mouth daily.    . cetirizine (ZYRTEC) 10 MG tablet  Take 10 mg by mouth daily as needed for allergies.    . Cinnamon 500 MG TABS Take 1,000 mg by mouth 2 (two) times daily.     . clopidogrel (PLAVIX) 75 MG tablet TAKE ONE TABLET BY MOUTH ONCE DAILY WITH BREAKFAST. 30 tablet 11  . CRESTOR 20 MG tablet Take 20 mg by mouth every evening.     . docusate sodium (COLACE) 100 MG capsule Take 100 mg by mouth 2 (two) times daily as needed for mild constipation.    [name] KOMBIGLYZE XR 01-999 MG TB24 Take 1 tablet by mouth daily with breakfast.     . metoprolol succinate (TOPROL-XL) 100 MG 24 hr tablet Take 1 tablet (100 mg total) by mouth daily. Take with or immediately following a meal. 30 tablet 0  . MULTAQ 400 MG tablet TAKE (1) TABLET BY MOUTH TWICE DAILY WITH A MEAL. 60 tablet 11  . niacin (NIASPAN) 1000 MG CR tablet Take 1,000 mg by mouth at bedtime.     . nitroGLYCERIN (NITROSTAT) 0.4 MG SL tablet Place 1 tablet (0.4 mg total) under the tongue every 5 (five) minutes as needed for chest pain. 25 tablet 3  . Omega-3 Fatty Acids (FISH OIL) 1200 MG CAPS Take 1 capsule by mouth 2 (two) times daily.     No current facility-administered medications for this visit.     Allergies  Review of patient's allergies indicates no known allergies.  Electrocardiogram:  2014  SR rate 55 normal QT 420 06/27/14  SR low voltage in inferior leads   Assessment and Plan CAD:  Stable with no angina and good  activity level.  Continue medical Rx with ASA/Plavix and beta blocker Stent to RCA in 2012 patent by cath in 2015 Aflutter:  maint NSR continue multaq Chol:  Continue niacin and statin recent labs with primary [facility] Neighbors Lab Results  Component Value Date   [facility] 34 01/24/2013  recent labs with Dr Elray Buba

## 2016-06-24 ENCOUNTER — Ambulatory Visit (INDEPENDENT_AMBULATORY_CARE_PROVIDER_SITE_OTHER): Payer: BLUE CROSS/BLUE SHIELD | Admitting: Cardiovascular Disease

## 2016-06-24 ENCOUNTER — Encounter: Payer: Self-pay | Admitting: Cardiovascular Disease

## 2016-06-24 VITALS — BP 144/86 | HR 69 | Ht 72.0 in | Wt 258.0 lb

## 2016-06-24 DIAGNOSIS — Z23 Encounter for immunization: Secondary | ICD-10-CM | POA: Diagnosis not present

## 2016-06-24 DIAGNOSIS — I10 Essential (primary) hypertension: Secondary | ICD-10-CM | POA: Diagnosis not present

## 2016-06-24 NOTE — Patient Instructions (Signed)

## 2016-07-29 ENCOUNTER — Telehealth: Payer: Self-pay | Admitting: Cardiovascular Disease

## 2016-07-29 NOTE — Telephone Encounter (Signed)
New message  Pt's wife is calling  Wants advise if an inversion table is ok for heart patients  Please call back and advise

## 2016-07-29 NOTE — Telephone Encounter (Signed)
Should be ok to use

## 2016-07-29 NOTE — Telephone Encounter (Signed)
Informed patient's wife of Dr. Kyla Balzarine recommendations. Patient's wife verbalized understanding and had no other questions.

## 2016-10-15 ENCOUNTER — Telehealth: Payer: Self-pay | Admitting: Cardiovascular Disease

## 2016-10-15 ENCOUNTER — Other Ambulatory Visit: Payer: Self-pay | Admitting: Cardiovascular Disease

## 2016-10-15 NOTE — Telephone Encounter (Signed)
Have him see afib clinic possible change to low dose amiodarone

## 2016-10-15 NOTE — Telephone Encounter (Signed)
New Message  Pt c/o medication issue:  1. Name of Medication: Multaq  2. How are you currently taking this medication (dosage and times per day)? 400mg    3. Are you having a reaction (difficulty breathing--STAT)? no  4. What is your medication issue? Per pt wife medication has become to expensive ans would like to know about alternate medications if any that are cheaper. Please call back to discuss

## 2016-10-18 NOTE — Telephone Encounter (Signed)
Called to offer patient appointment. Wife states they only want to see Dr. Johnsie Cancel. Informed wife I would send a message back to his nurse to have appointment arranged for follow up to transition medication as multaq is a branded drug and they do not currently have drug insurance.

## 2016-10-18 NOTE — Telephone Encounter (Signed)
Called patient's wife and explained the reason for patient seeing the [facility]. Fib clinic and how it will benefit the patient. Informed patient's wife that Dr. Johnsie Cancel will still be his cardiologist. Will send back to [facility] clinic to schedule.

## 2016-10-29 ENCOUNTER — Inpatient Hospital Stay (HOSPITAL_COMMUNITY)
Admission: RE | Admit: 2016-10-29 | Payer: BLUE CROSS/BLUE SHIELD | Source: Ambulatory Visit | Admitting: Nurse Practitioner

## 2016-10-29 ENCOUNTER — Encounter (INDEPENDENT_AMBULATORY_CARE_PROVIDER_SITE_OTHER): Payer: Self-pay | Admitting: *Deleted

## 2016-11-05 ENCOUNTER — Encounter (HOSPITAL_COMMUNITY): Payer: Self-pay | Admitting: Nurse Practitioner

## 2016-11-05 ENCOUNTER — Ambulatory Visit (HOSPITAL_COMMUNITY)
Admission: RE | Admit: 2016-11-05 | Discharge: 2016-11-05 | Disposition: A | Discharge status: Home / Self Care | Payer: Self-pay | Source: Ambulatory Visit | Attending: Nurse Practitioner | Admitting: Nurse Practitioner

## 2016-11-05 VITALS — BP 140/94 | HR 63 | Ht 72.0 in | Wt 261.2 lb

## 2016-11-05 DIAGNOSIS — E669 Obesity, unspecified: Secondary | ICD-10-CM | POA: Insufficient documentation

## 2016-11-05 DIAGNOSIS — Z7982 Long term (current) use of aspirin: Secondary | ICD-10-CM | POA: Insufficient documentation

## 2016-11-05 DIAGNOSIS — Z87891 Personal history of nicotine dependence: Secondary | ICD-10-CM | POA: Insufficient documentation

## 2016-11-05 DIAGNOSIS — Z87442 Personal history of urinary calculi: Secondary | ICD-10-CM | POA: Insufficient documentation

## 2016-11-05 DIAGNOSIS — I10 Essential (primary) hypertension: Secondary | ICD-10-CM | POA: Insufficient documentation

## 2016-11-05 DIAGNOSIS — I4892 Unspecified atrial flutter: Secondary | ICD-10-CM | POA: Insufficient documentation

## 2016-11-05 DIAGNOSIS — Z7984 Long term (current) use of oral hypoglycemic drugs: Secondary | ICD-10-CM | POA: Insufficient documentation

## 2016-11-05 DIAGNOSIS — I252 Old myocardial infarction: Secondary | ICD-10-CM | POA: Insufficient documentation

## 2016-11-05 DIAGNOSIS — I4891 Unspecified atrial fibrillation: Secondary | ICD-10-CM | POA: Insufficient documentation

## 2016-11-05 DIAGNOSIS — I2510 Atherosclerotic heart disease of native coronary artery without angina pectoris: Secondary | ICD-10-CM | POA: Insufficient documentation

## 2016-11-05 DIAGNOSIS — Z8249 Family history of ischemic heart disease and other diseases of the circulatory system: Secondary | ICD-10-CM | POA: Insufficient documentation

## 2016-11-05 DIAGNOSIS — I480 Paroxysmal atrial fibrillation: Secondary | ICD-10-CM

## 2016-11-05 DIAGNOSIS — Z79899 Other long term (current) drug therapy: Secondary | ICD-10-CM | POA: Insufficient documentation

## 2016-11-05 DIAGNOSIS — E119 Type 2 diabetes mellitus without complications: Secondary | ICD-10-CM | POA: Insufficient documentation

## 2016-11-05 DIAGNOSIS — Z9889 Other specified postprocedural states: Secondary | ICD-10-CM | POA: Insufficient documentation

## 2016-11-05 DIAGNOSIS — E785 Hyperlipidemia, unspecified: Secondary | ICD-10-CM | POA: Insufficient documentation

## 2016-11-05 NOTE — Progress Notes (Signed)
Primary Care Physician: Wende Neighbors, MD Referring Physician: P.Johnsie Cancel, MD   [name] is a 56 y.o. male with a h/o CAD, DM, HTN, paroxysmal afib that has been quiet for years on Multaq.He has been referred to the afib clinic to discuss cheaper options. He is self employed and was paying $80 a month with [employer], which he could afford. However, the price this year of [employer] went up to $2000 a month and they were not able to afford. They have since signed up for [place] which is cheaper than [employer] but has no drug coverage and they now are paying $600 a month for multaq alone. 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   Options for antiarrythmic's were discussed and unfortunately there are no good options. He is not a candidate for flecainide due to CAD. Amiodarone is not ideal for relatively young age and potential side effects. Tikosyn would be $700 a month out of pocket and sotalol would probably require hospitalization for initiation, which they would have to bear the majority of the cost for the hospitalization.  Today, he denies symptoms of palpitations, chest pain, shortness of breath, orthopnea, PND, lower extremity edema, dizziness, presyncope, syncope, or neurologic sequela. The patient is tolerating medications without difficulties and is otherwise without complaint today.   Past Medical History:  Diagnosis Date  . Atrial flutter ([place])    a. Dx 12/2010 - briefly on coumadin in 2012, CHADS2 = 2/CHA2DS2VASc = 3;  b. No recurrence, on Multaq;  c. 08/2012 Echo: EF 55-60%.  [name] CAD (coronary artery disease)    a. 01/2005 NSTEMI/PCI: LM nl, LAD nl, RI 20 ost, LCX nl, RCA 30p/29m (3.5x23 Cypher DES), EF 60%  . Diabetes mellitus, type II ([place])   . HTN (hypertension)   . Hyperlipidemia   . Nephrolithiasis    a. s/p lithotripsy  . Obesity   . Ureteral stone 10/01/2013   Past Surgical History:  Procedure Laterality Date  .  stents  2006   cardiac stents  .  CARDIAC CATHETERIZATION  01/13/2005   LEFT HEART  . carpel tunnel Bilateral    [name]  . LEFT HEART CATHETERIZATION  05/12/2007  . LEFT HEART CATHETERIZATION WITH CORONARY ANGIOGRAM N/A 06/27/2014   Procedure: LEFT HEART CATHETERIZATION WITH CORONARY ANGIOGRAM;  Surgeon: [name], MD;  Location: [facility] CATH LAB;  Service: Cardiovascular;  Laterality: N/A;    Current Outpatient Prescriptions  Medication Sig Dispense Refill  . aspirin EC 81 MG tablet Take 81 mg by mouth daily.    . cetirizine (ZYRTEC) 10 MG tablet Take 10 mg by mouth daily as needed for allergies.    . Cinnamon 500 MG TABS Take 1,000 mg by mouth 2 (two) times daily.     . clopidogrel (PLAVIX) 75 MG tablet TAKE ONE TABLET BY MOUTH ONCE DAILY WITH BREAKFAST. 30 tablet 8  . CRESTOR 20 MG tablet Take 20 mg by mouth every evening.     . docusate sodium (COLACE) 100 MG capsule Take 100 mg by mouth 2 (two) times daily as needed for mild constipation.    [name] KOMBIGLYZE XR 01-999 MG TB24 Take 1 tablet by mouth daily with breakfast.     . metoprolol succinate (TOPROL-XL) 100 MG 24 hr tablet Take 1 tablet (100 mg total) by mouth daily. Take with or immediately following a meal. 30 tablet 0  . MULTAQ 400 MG tablet TAKE (1) TABLET BY MOUTH TWICE DAILY WITH A MEAL.  60 tablet 11  . niacin (NIASPAN) 1000 MG CR tablet Take 1,000 mg by mouth at bedtime.     . nitroGLYCERIN (NITROSTAT) 0.4 MG SL tablet Place 1 tablet (0.4 mg total) under the tongue every 5 (five) minutes as needed for chest pain. 25 tablet 3  . Omega-3 Fatty Acids (FISH OIL) 1200 MG CAPS Take 1 capsule by mouth 2 (two) times daily.    . metFORMIN (GLUCOPHAGE) 500 MG tablet Take 500 mg by mouth 2 (two) times daily with a meal.     No current facility-administered medications for this encounter.     No Known Allergies  Social History   Social History  . Marital status: Married    Spouse name: N/A  . Number of children: N/A  . Years of education: N/A    Occupational History  . Not on file.   Social History Main Topics  . Smoking status: Former Smoker    Years: 8.00    Types: Cigarettes    Quit date: 09/22/2004  . Smokeless tobacco: Never Used  . Alcohol use No  . Drug use: No  . Sexual activity: Yes   Other Topics Concern  . Not on file   Social History Narrative   Lives with wife in [place].  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    Family History  Problem Relation Age of Onset  . Hypertension Mother     ROS- All systems are reviewed and negative except as per the HPI above  Physical Exam: Vitals:   11/05/16 0835  BP: (!) 140/94  Pulse: 63  Weight: 261 lb 3.2 oz (118.5 kg)  Height: 6' (1.829 m)   Wt Readings from Last 3 Encounters:  11/05/16 261 lb 3.2 oz (118.5 kg)  06/24/16 258 lb (117 kg)  06/23/15 259 lb 12.8 oz (117.8 kg)    Labs: Lab Results  Component Value Date   NA 138 06/17/2014   K 4.2 06/17/2014   CL 106 06/17/2014   CO2 24 06/17/2014   GLUCOSE 112 (H) 06/17/2014   BUN 16 06/17/2014   CREATININE 1.0 06/17/2014   CALCIUM 8.6 06/17/2014   Lab Results  Component Value Date   INR 1.0 06/17/2014   Lab Results  Component Value Date   CHOL 90 01/24/2013   HDL 30 (L) 01/24/2013   LDLCALC 34 01/24/2013   TRIG 129 01/24/2013     GEN- The patient is well appearing, alert and oriented x 3 today.   Head- normocephalic, atraumatic Eyes-  Sclera clear, conjunctiva pink Ears- hearing intact Oropharynx- clear Neck- supple, no JVP Lymph- no cervical lymphadenopathy Lungs- Clear to ausculation bilaterally, normal work of breathing Heart- Regular rate and rhythm, no murmurs, rubs or gallops, PMI not laterally displaced GI- soft, NT, ND, + BS Extremities- no clubbing, cyanosis, or edema MS- no significant deformity or atrophy Skin- no rash or lesion Psych- euthymic mood, full affect Neuro- strength and sensation are intact  EKG- NSR at 63 bpm, Pr  int 192 ms, qrs int 96 ms, qtc 417 ms Epic records reviewed    Assessment and Plan: 1. afib No afib burden, quiet on multaq Pt is happy on multaq but cannot afford Will try to see if he can get pt assistance for the drug because in essence he has no drug coverage Samples given for the next month until decision is made by drug company  2. CAD Stable  Will discuss with pt further  when decision is known  Geroge Baseman. Hollynn Garno, [facility] [address] [place],  [address] [phone]

## 2016-11-19 ENCOUNTER — Telehealth (HOSPITAL_COMMUNITY): Payer: Self-pay | Admitting: *Deleted

## 2016-11-19 NOTE — Telephone Encounter (Signed)
Patient was denied patient assistance for Multaq due to income. Notified patient clinic would try to help with samples as able due to costs of hospitalization for sotalol load. May have to look to this in future but for now pt would be prefer to stay on multaq.

## 2016-11-24 ENCOUNTER — Other Ambulatory Visit: Payer: Self-pay | Admitting: Cardiovascular Disease

## 2017-06-20 NOTE — Progress Notes (Signed)
Patient ID: [name], male   DOB: May 21, 1961, 56 y.o.   MRN: [id number] 56 y.o. male with a prior history of coronary artery disease, paroxysmal atrial flutter,hypertension, hyperlipidemia, diabetes mellitus, and nephrolithiasis.     He suffered a non-ST segment elevation myocardial infarction in May 2006, and underwent successful PCI and drug loading stent placement to the mid right coronary artery. In 2012, he developed palpitations and was found to have atrial flutter. He was initially placed on diltiazem and Coumadin therapy and scheduled for a TEE and cardioversion however the patient spontaneously converted to sinus rhythm. At some point, his Coumadin was discontinued and he was instead placed on multaq, , which he remains on today.  He has been on chronic aspirin and Plavix therapy since his MI in 2006. 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  He is self employed and cannot afford multaq. Only other option would be sotolol but cost of hospitalization to start would also be prohibitive. Amiodarone not ideal given relatively young age Turned  Down for assistance by drug company as income too high Got Some samples from afib clinic    ROS: Denies fever, malais, weight loss, blurry vision, decreased visual acuity, cough, sputum, SOB, hemoptysis, pleuritic pain, palpitaitons, heartburn, abdominal pain, melena, lower extremity edema, claudication, or rash.  All other systems reviewed and negative  General: Affect appropriate Healthy:  appears stated age 56: normal Neck supple with no adenopathy JVP normal no bruits no thyromegaly Lungs clear with no wheezing and good diaphragmatic motion Heart:  S1/S2 no murmur, no rub, gallop or click PMI normal Abdomen: benighn, BS positve, no tenderness, no AAA no  bruit.  No HSM or HJR Distal pulses intact with no bruits No edema Neuro non-focal Skin warm and dry No muscular weakness    Current Outpatient Prescriptions  Medication Sig Dispense Refill  . aspirin EC 81 MG tablet Take 81 mg by mouth daily.    . cetirizine (ZYRTEC) 10 MG tablet Take 10 mg by mouth daily as needed for allergies.    . Cinnamon 500 MG TABS Take 1,000 mg by mouth 2 (two) times daily.     . clopidogrel (PLAVIX) 75 MG tablet TAKE ONE TABLET BY MOUTH ONCE DAILY WITH BREAKFAST. 30 tablet 6  . CRESTOR 20 MG tablet Take 20 mg by mouth every evening.     . docusate sodium (COLACE) 100 MG capsule Take 100 mg by mouth 2 (two) times daily as needed for mild constipation.    [name] KOMBIGLYZE XR 01-999 MG TB24 Take 1 tablet by mouth daily with breakfast.     . metFORMIN (GLUCOPHAGE) 500 MG tablet Take 500 mg by mouth 2 (two) times daily with a meal.    . metoprolol succinate (TOPROL-XL) 100 MG 24 hr tablet Take 1 tablet (100 mg total) by mouth daily. Take with or immediately following a meal. 30 tablet 0  . MULTAQ 400 MG tablet TAKE (1) TABLET BY MOUTH TWICE DAILY WITH A MEAL. 60 tablet 11  . niacin (NIASPAN) 1000 MG CR tablet Take 1,000 mg by mouth at bedtime.     . nitroGLYCERIN (NITROSTAT) 0.4 MG SL tablet Place 1 tablet (0.4 mg total) under the tongue every 5 (five) minutes as needed for chest pain. 25 tablet 3  . Omega-3  Fatty Acids (FISH OIL) 1200 MG CAPS Take 1 capsule by mouth 2 (two) times daily.     No current facility-administered medications for this visit.     Allergies  Patient has no known allergies.  Electrocardiogram:  2014  SR rate 55 normal QT 420 06/27/14  SR low voltage in inferior leads 10.9.18 NSR normal ECG  Assessment and Plan CAD:  Stable with no angina and good activity level.  Continue medical Rx with ASA/Plavix and beta blocker Stent to RCA in 2012 patent by cath in 2015  Aflutter:  maint NSR quiescent Cannot afford Multaq but will still try to take  it He knows when he is in it ECG Normal today was not interested in cost of hospitalization to start sotolol and with severe LAE not ideal For ablation and cost issues worse for this Despite CHADVASC more than 2 he was only been briefly On coumadin in 2012 and since he has had no arrhythmia for 6 years is currently not taking anticoagulation  Chol:  Continue niacin and statin recent labs with primary Wende Neighbors Lab Results  Component Value Date   [facility] 34 01/24/2013  recent labs with Dr Elray Buba

## 2017-06-21 ENCOUNTER — Encounter: Payer: Self-pay | Admitting: Cardiovascular Disease

## 2017-06-21 ENCOUNTER — Ambulatory Visit (INDEPENDENT_AMBULATORY_CARE_PROVIDER_SITE_OTHER): Payer: Self-pay | Admitting: Cardiovascular Disease

## 2017-06-21 VITALS — BP 120/68 | HR 59 | Ht 72.0 in | Wt 251.0 lb

## 2017-06-21 DIAGNOSIS — I480 Paroxysmal atrial fibrillation: Secondary | ICD-10-CM

## 2017-06-21 NOTE — Patient Instructions (Signed)

## 2017-06-24 ENCOUNTER — Ambulatory Visit: Payer: Self-pay | Admitting: Cardiovascular Disease

## 2017-07-08 ENCOUNTER — Other Ambulatory Visit: Payer: Self-pay | Admitting: Cardiovascular Disease

## 2018-01-27 NOTE — Progress Notes (Signed)
Patient ID: NIL XIONG, male   DOB: Feb 18, 1961, 57 y.o.   MRN: [id number]   57 y.o. f/u CAD.  SEMI 2006 with DES to RCA. Cath in 2015 with patent stent no left sided disease. 2012 had episode of atrial flutter. Spontaneously converted and was only on coumadin briefly. Been on Multaq since but hard to afford.  He is self employed [name], quantity    His two boys help out  Having some breakthrough PaF but not much   ROS: Denies fever, malais, weight loss, blurry vision, decreased visual acuity, cough, sputum, SOB, hemoptysis, pleuritic pain, palpitaitons, heartburn, abdominal pain, melena, lower extremity edema, claudication, or rash.  All other systems reviewed and negative  General: Affect appropriate Healthy:  appears stated age 63: normal Neck supple with no adenopathy JVP normal no bruits no thyromegaly Lungs clear with no wheezing and good diaphragmatic motion Heart:  S1/S2 no murmur, no rub, gallop or click PMI normal Abdomen: benighn, BS positve, no tenderness, no AAA no bruit.  No HSM or HJR Distal pulses intact with no bruits No edema Neuro non-focal Skin warm and dry No muscular weakness    Current Outpatient Medications  Medication Sig Dispense Refill  . aspirin EC 81 MG tablet Take 81 mg by mouth daily.    . cetirizine (ZYRTEC) 10 MG tablet Take 10 mg by mouth daily as needed for allergies.    . Cinnamon 500 MG TABS Take 1,000 mg by mouth 2 (two) times daily.     . clopidogrel (PLAVIX) 75 MG tablet TAKE ONE TABLET BY MOUTH ONCE DAILY WITH BREAKFAST. 30 tablet 6  . CRESTOR 20 MG tablet Take 20 mg by mouth every evening.     . docusate sodium (COLACE) 100 MG capsule Take 100 mg by mouth 2 (two) times daily as needed for mild constipation.    . metFORMIN (GLUCOPHAGE) 500 MG tablet Take 1,000 mg by mouth 2 (two) times daily with a meal.     . metoprolol succinate (TOPROL-XL) 100 MG 24 hr tablet Take 1 tablet (100 mg total) by mouth daily. Take with or  immediately following a meal. 30 tablet 0  . MULTAQ 400 MG tablet TAKE (1) TABLET BY MOUTH TWICE DAILY WITH A MEAL. 60 tablet 11  . niacin (NIASPAN) 1000 MG CR tablet Take 1,000 mg by mouth at bedtime.     . nitroGLYCERIN (NITROSTAT) 0.4 MG SL tablet Place 1 tablet (0.4 mg total) under the tongue every 5 (five) minutes as needed for chest pain. 25 tablet 3  . Omega-3 Fatty Acids (FISH OIL) 1200 MG CAPS Take 1 capsule by mouth 2 (two) times daily.     No current facility-administered medications for this visit.     Allergies  Patient has no known allergies.  Electrocardiogram:  2014  SR rate 55 normal QT 420 06/27/14  SR low voltage in inferior leads 10.9.18 NSR normal ECG  Assessment and Plan CAD:  Stable with no angina and good activity level.  Continue medical Rx with ASA/Plavix and beta blocker Stent to RCA in 2012 patent by cath in 2015  Aflutter:  maint NSR quiescent Cannot afford Multaq but will still try to take it He knows when he is in it ECG Normal today was not interested in cost of hospitalization to start sotolol and with severe LAE not ideal For ablation and cost issues worse for this Despite CHADVASC more than 2 he was only been briefly On coumadin in 2012 and since  he has had no arrhythmia for 6 years is currently not taking anticoagulation  Chol:  Continue niacin and statin recent labs with primary Wende Neighbors Lab Results  Component Value Date   [facility] 34 01/24/2013  recent labs with Dr Nevada Crane   DM:  Discussed low carb diet.  Target hemoglobin A1c is 6.5 or less.  Continue current medications.  Jenkins Rouge

## 2018-02-01 ENCOUNTER — Ambulatory Visit (INDEPENDENT_AMBULATORY_CARE_PROVIDER_SITE_OTHER): Payer: Self-pay | Admitting: Cardiovascular Disease

## 2018-02-01 ENCOUNTER — Encounter: Payer: Self-pay | Admitting: Cardiovascular Disease

## 2018-02-01 VITALS — BP 126/70 | HR 68 | Ht 73.0 in | Wt 248.0 lb

## 2018-02-01 DIAGNOSIS — I480 Paroxysmal atrial fibrillation: Secondary | ICD-10-CM

## 2018-02-01 NOTE — Patient Instructions (Addendum)

## 2018-02-08 ENCOUNTER — Other Ambulatory Visit: Payer: Self-pay | Admitting: Cardiovascular Disease

## 2018-03-11 ENCOUNTER — Other Ambulatory Visit: Payer: Self-pay | Admitting: Cardiovascular Disease

## 2018-04-15 ENCOUNTER — Other Ambulatory Visit: Payer: Self-pay | Admitting: Cardiovascular Disease

## 2018-08-01 NOTE — Progress Notes (Signed)
Patient ID: [name], male   DOB: March 22, 1961, 57 y.o.   MRN: [id number]     57 y.o. f/u CAD.  SEMI 2006 with DES to RCA. Cath in 2015 with patent stent no left sided disease. 2012 had episode of atrial flutter. Spontaneously converted and was only on coumadin briefly. Been on Multaq since but hard to afford.  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  All other systems reviewed and negative  General: BP (!) 142/82   Pulse (!) 59   Ht 6' (1.829 m)   Wt 241 lb (109.3 kg)   SpO2 96%   BMI 32.69 kg/m  Affect appropriate Healthy:  appears stated age 85: normal Neck supple with no adenopathy JVP normal no bruits no thyromegaly Lungs clear with no wheezing and good diaphragmatic motion Heart:  S1/S2 no murmur, no rub, gallop or click PMI normal Abdomen: benighn, BS positve, no tenderness, no AAA no bruit.  No HSM or HJR Distal pulses intact with no bruits No edema Neuro non-focal Skin warm and dry No muscular weakness    Current Outpatient Medications  Medication Sig Dispense Refill  . aspirin EC 81 MG tablet Take 81 mg by mouth daily.    . cetirizine (ZYRTEC) 10 MG tablet Take 10 mg by mouth daily as needed for allergies.    . Cinnamon 500 MG TABS Take 1,000 mg by mouth 2 (two) times daily.     . clopidogrel (PLAVIX) 75 MG tablet TAKE ONE TABLET BY MOUTH ONCE DAILY WITH BREAKFAST. 30 tablet 6  . CRESTOR 20 MG tablet Take 20 mg by mouth every evening.     . docusate sodium (COLACE) 100 MG capsule Take 100 mg by mouth 2 (two) times  daily as needed for mild constipation.    . metFORMIN (GLUCOPHAGE) 1000 MG tablet Take 1,000 mg by mouth 2 (two) times daily with a meal.    . metoprolol succinate (TOPROL-XL) 100 MG 24 hr tablet Take 1 tablet (100 mg total) by mouth daily. Take with or immediately following a meal. 30 tablet 0  . MULTAQ 400 MG tablet TAKE (1) TABLET BY MOUTH TWICE DAILY WITH A MEAL. 60 tablet 11  . niacin (NIASPAN) 1000 MG CR tablet Take 1,000 mg by mouth at bedtime.     . nitroGLYCERIN (NITROSTAT) 0.4 MG SL tablet Place 1 tablet (0.4 mg total) under the tongue every 5 (five) minutes as needed for chest pain. 25 tablet 3  . Omega-3 Fatty Acids (FISH OIL) 1200 MG CAPS Take 1 capsule by mouth 2 (two) times daily.     No current facility-administered medications for this visit.     Allergies  Patient has no known allergies.  Electrocardiogram:  2014  SR rate 55 normal QT 420 06/27/14  SR low voltage in inferior leads 10.9.18 NSR normal ECG 08/04/18 SR rate 56 isolated Q in lead 3  Assessment and Plan CAD:  Stable with no angina and good activity level.  Continue medical Rx with ASA/Plavix and beta blocker Stent to RCA in 2012 patent  by cath in 2015  Aflutter:  maint NSR quiescent  Not interested in cost of hospitalization to start sotolol or have ablation   He gets samples of Multaq from afib clinic on occasion   Chol:  Continue niacin and statin recent labs with primary Wende Neighbors Lab Results  Component Value Date   [facility] 34 01/24/2013  recent labs with Dr Nevada Crane   DM:  Discussed low carb diet.  Target hemoglobin A1c is 6.5 or less.  Continue current medications.  Nausea:  In am with breakfast smells Got lab work with Dr Nevada Crane pending check LFTls may need GB US  Jenkins Rouge

## 2018-08-04 ENCOUNTER — Ambulatory Visit (INDEPENDENT_AMBULATORY_CARE_PROVIDER_SITE_OTHER): Payer: Self-pay | Admitting: Cardiovascular Disease

## 2018-08-04 ENCOUNTER — Encounter: Payer: Self-pay | Admitting: Cardiovascular Disease

## 2018-08-04 VITALS — BP 142/82 | HR 59 | Ht 72.0 in | Wt 241.0 lb

## 2018-08-04 DIAGNOSIS — I4892 Unspecified atrial flutter: Secondary | ICD-10-CM

## 2018-08-04 NOTE — Patient Instructions (Signed)
Medication Instructions:  Your physician recommends that you continue on your current medications as directed. Please refer to the Current Medication list given to you today.  If you need a refill on your cardiac medications before your next appointment, please call your pharmacy.   Lab work: none If you have labs (blood work) drawn today and your tests are completely normal, you will receive your results only by: [name] MyChart Message (if you have MyChart) OR . A paper copy in the mail If you have any lab test that is abnormal or we need to change your treatment, we will call you to review the results.  Testing/Procedures: none  Follow-Up: At [facility], you and your health needs are our priority.  As part of our continuing mission to provide you with exceptional heart care, we have created designated Provider Care Teams.  These Care Teams include your primary Cardiologist (physician) and Advanced Practice Providers (APPs -  Physician Assistants and Nurse Practitioners) who all work together to provide you with the care you need, when you need it. You will need a follow up appointment in 1 years.  Please call our office 2 months in advance to schedule this appointment.  You may see Jenkins Rouge, MD or one of the following Advanced Practice Providers on your designated Care Team:   Bernerd Pho, PA-C [facility]) . Ermalinda Barrios, PA-C ([place])  Any Other Special Instructions Will Be Listed Below (If Applicable). None

## 2018-08-14 ENCOUNTER — Encounter: Payer: Self-pay | Admitting: Gastroenterology

## 2018-09-22 ENCOUNTER — Other Ambulatory Visit: Payer: Self-pay

## 2018-09-22 ENCOUNTER — Encounter: Payer: Self-pay | Admitting: Nurse Practitioner

## 2018-09-22 ENCOUNTER — Ambulatory Visit (INDEPENDENT_AMBULATORY_CARE_PROVIDER_SITE_OTHER): Payer: Self-pay | Admitting: Nurse Practitioner

## 2018-09-22 DIAGNOSIS — K625 Hemorrhage of anus and rectum: Secondary | ICD-10-CM

## 2018-09-22 DIAGNOSIS — K649 Unspecified hemorrhoids: Secondary | ICD-10-CM | POA: Insufficient documentation

## 2018-09-22 MED ORDER — PEG 3350-KCL-NA BICARB-NACL 420 G PO SOLR: 4000.0000 mL | ORAL | 0 refills | Status: DC

## 2018-09-22 MED ORDER — HYDROCORTISONE 2.5 % RE CREA: 1.0000 "application " | TOPICAL_CREAM | Freq: Two times a day (BID) | RECTAL | 1 refills | Status: DC

## 2018-09-22 NOTE — Progress Notes (Signed)
Primary Care Physician:  Celene Squibb, MD Primary Gastroenterologist:  Dr. Oneida Alar  Chief Complaint  Patient presents with  . Consult    TCS. never had done prior  . Hemorrhoids    bleeding at times with BM's. Does not strain    HPI:   [name] is a 58 y.o. male who presents on referral from primary care to schedule colonoscopy.  Office visit was scheduled due to concerns about hemorrhoids.  Reviewed information provided with the referral including office visit dated 08/04/2018.  At that time the patient again noted hemorrhoid symptoms.  The patient was again recommended to follow-up with GI.  No history of colonoscopy in our system.  Today he states he is doing well overall.  He has never had a colonoscopy before.  He does have hemorrhoid symptoms and some mild bleeding, although he denies overt constipation and straining. No sensation of incomplete emptying. Hemorrhoid symptoms started 6 months ago. He has sensation of bulging after a bowel movement. His wife states he sits on the commode for 30-45 minutes and is often on his cell phone. Has difficulty cleaning. Has scant toilet tissue hematochezia. Post-bowel movement itching and irritation, tenderness. Has only tried Preparation H, which has helped some but not resolved. Has intermittent morning-time nausea but no vomiting which typically self resolves. Denies abdominal pain, melena, fever, chills, unintentional weight loss. Has had 20 lbs weight loss with dietary changes. Denies chest pain, dyspnea, dizziness, lightheadedness, syncope, near syncope. Denies any other upper or lower GI symptoms.   History of MI in 2006. Saw cardiology a month ago and was told everything looks good.  Past Medical History:  Diagnosis Date  . Atrial flutter ([place])    a. Dx 12/2010 - briefly on coumadin in 2012, CHADS2 = 2/CHA2DS2VASc = 3;  b. No recurrence, on Multaq;  c. 08/2012 Echo: EF 55-60%.  [name] CAD (coronary artery disease)    a. 01/2005  NSTEMI/PCI: LM nl, LAD nl, RI 20 ost, LCX nl, RCA 30p/48m (3.5x23 Cypher DES), EF 60%  . Diabetes mellitus, type II ([place])   . HTN (hypertension)   . Hyperlipidemia   . Nephrolithiasis    a. s/p lithotripsy  . Obesity   . Ureteral stone 10/01/2013    Past Surgical History:  Procedure Laterality Date  .  stents  2006   cardiac stents  . CARDIAC CATHETERIZATION  01/13/2005   LEFT HEART  . carpel tunnel Bilateral    [name]  . LEFT HEART CATHETERIZATION  05/12/2007  . LEFT HEART CATHETERIZATION WITH CORONARY ANGIOGRAM N/A 06/27/2014   Procedure: LEFT HEART CATHETERIZATION WITH CORONARY ANGIOGRAM;  Surgeon: [name], MD;  Location: [facility] CATH LAB;  Service: Cardiovascular;  Laterality: N/A;    Current Outpatient Medications  Medication Sig Dispense Refill  . aspirin EC 81 MG tablet Take 81 mg by mouth daily.    . cetirizine (ZYRTEC) 10 MG tablet Take 10 mg by mouth daily as needed for allergies.    . Cinnamon 500 MG TABS Take 1,000 mg by mouth 2 (two) times daily.     . clopidogrel (PLAVIX) 75 MG tablet TAKE ONE TABLET BY MOUTH ONCE DAILY WITH BREAKFAST. 30 tablet 6  . CRESTOR 20 MG tablet Take 20 mg by mouth every evening.     . metFORMIN (GLUCOPHAGE) 1000 MG tablet Take 1,000 mg by mouth 2 (two) times daily with a meal.    . metoprolol succinate (TOPROL-XL) 100 MG 24 hr tablet  Take 1 tablet (100 mg total) by mouth daily. Take with or immediately following a meal. 30 tablet 0  . MULTAQ 400 MG tablet TAKE (1) TABLET BY MOUTH TWICE DAILY WITH A MEAL. 60 tablet 11  . niacin (NIASPAN) 1000 MG CR tablet Take 1,000 mg by mouth at bedtime.     . nitroGLYCERIN (NITROSTAT) 0.4 MG SL tablet Place 1 tablet (0.4 mg total) under the tongue every 5 (five) minutes as needed for chest pain. 25 tablet 3  . Omega-3 Fatty Acids (FISH OIL) 1200 MG CAPS Take 1 capsule by mouth 2 (two) times daily.     No current facility-administered medications for this visit.     Allergies as of 09/22/2018  .  (No Known Allergies)    Family History  Problem Relation Age of Onset  . Hypertension Mother   . Colon cancer Neg Hx     Social History   Socioeconomic History  . Marital status: Married    Spouse name: Not on file  . Number of children: Not on file  . Years of education: Not on file  . Highest education level: Not on file  Occupational History  . Not on file  Social Needs  . Financial resource strain: Not on file  . Food insecurity:    Worry: Not on file    Inability: Not on file  . Transportation needs:    Medical: Not on file    Non-medical: Not on file  Tobacco Use  . Smoking status: Former Smoker    Years: 8.00    Types: Cigarettes    Last attempt to quit: 09/22/2004    Years since quitting: 14.0  . Smokeless tobacco: Never Used  Substance and Sexual Activity  . Alcohol use: No    Alcohol/week: 0.0 standard drinks  . Drug use: No  . Sexual activity: Yes  Lifestyle  . Physical activity:    Days per week: Not on file    Minutes per session: Not on file  . Stress: Not on file  Relationships  . Social connections:    Talks on phone: Not on file    Gets together: Not on file    Attends religious service: Not on file    Active member of club or organization: Not on file    Attends meetings of clubs or organizations: Not on file    Relationship status: Not on file  . Intimate partner violence:    Fear of current or ex partner: Not on file    Emotionally abused: Not on file    Physically abused: Not on file    Forced sexual activity: Not on file  Other Topics Concern  . Not on file  Social History Narrative   Lives with wife in [place].  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    Review of Systems: Complete ROS negative except as per HPI.    Physical Exam: BP (!) 159/79   Pulse (!) 59   Temp (!) 97.1 F (36.2 C) (Oral)   Ht 6' (1.829 m)   Wt 241 lb 6.4 oz (109.5 kg)   BMI 32.74 kg/m  General:   Alert and  oriented. Pleasant and cooperative. Well-nourished and well-developed.  Head:  Normocephalic and atraumatic. Eyes:  Without icterus, sclera clear and conjunctiva pink.  Ears:  Normal auditory acuity. Cardiovascular:  S1, S2 present without murmurs appreciated. Extremities without clubbing or edema. Respiratory:  Clear to auscultation bilaterally. No  wheezes, rales, or rhonchi. No distress.  Gastrointestinal:  +BS, soft, non-tender and non-distended. No HSM noted. No guarding or rebound. No masses appreciated.  Rectal:  Deferred  Musculoskalatal:  Symmetrical without gross deformities. Normal posture. Neurologic:  Alert and oriented x4;  grossly normal neurologically. Psych:  Alert and cooperative. Normal mood and affect. Heme/Lymph/Immune: No excessive bruising noted.    09/22/2018 10:43 AM   Disclaimer: This note was dictated with voice recognition software. Similar sounding words can inadvertently be transcribed and may not be corrected upon review.

## 2018-09-22 NOTE — Patient Instructions (Signed)
EG advised for pt to take 1/2 dose of Metformin night before TCS and none the morning of. Called and informed pt's wife. Letter mailed.

## 2018-09-22 NOTE — Patient Instructions (Signed)
1. I have sent Anusol rectal cream to your pharmacy.  You can use this up to twice a day for up to 10 days at a time. 2. Limit toilet time to 5 minutes, 10 minutes at most. 3. We will schedule your colonoscopy for you with possible banding. 4. Further recommendations will be based on your colonoscopy. 5. Return for follow-up in 4 months. 6. Call us if you have any questions or concerns.  At [facility] we value your feedback. You may receive a survey about your visit today. Please share your experience as we strive to create trusting relationships with our patients to provide genuine, compassionate, quality care.  We appreciate your understanding and patience as we review any laboratory studies, imaging, and other diagnostic tests that are ordered as we care for you. Our office policy is 5 business days for review of these results, and any emergent or urgent results are addressed in a timely manner for your best interest. If you do not hear from our office in 1 week, please contact us.   We also encourage the use of MyChart, which contains your medical information for your review as well. If you are not enrolled in this feature, an access code is on this after visit summary for your convenience. Thank you for allowing [place] to be involved in your care.  It was great to see you today!  I hope you have a great start to the new year!!

## 2018-09-22 NOTE — Assessment & Plan Note (Signed)
The patient describes hemorrhoid symptoms including bulging, post bowel movement itching, toilet tissue hematochezia.  He has previous use Preparation H.  At this point I will send in Anusol to his pharmacy.  We will schedule colonoscopy with possible hemorrhoid banding as per below.  Recommend he limits toilet time to 5 minutes (previously with sit on the commode for 30 to 45 minutes while using his cell phone, per his wife).  Follow-up in 4 months.

## 2018-09-22 NOTE — Assessment & Plan Note (Signed)
The patient describes rectal bleeding in the form of scant toilet tissue hematochezia in the setting of likely hemorrhoids.  Further hemorrhoid treatment as per above.  He is 58 years old and is never had a colonoscopy so he is overdue regardless.  We will schedule a colonoscopy with possible hemorrhoid banding.  Return for follow-up in 4 months.

## 2018-09-25 NOTE — Progress Notes (Signed)
cc'ed to pcp °

## 2018-11-29 ENCOUNTER — Telehealth: Payer: Self-pay | Admitting: Gastroenterology

## 2018-11-29 NOTE — Telephone Encounter (Signed)
Called pt and he has rescheduled to 6/26 at 8:30am. I have mailed new instructions. I called carolyn in endo and made aware.

## 2018-11-29 NOTE — Telephone Encounter (Signed)
[phone] please call patient, she needs to reschedule her procedure

## 2019-01-04 ENCOUNTER — Other Ambulatory Visit: Payer: Self-pay | Admitting: Cardiovascular Disease

## 2019-01-23 ENCOUNTER — Ambulatory Visit: Payer: Self-pay | Admitting: Nurse Practitioner

## 2019-02-27 ENCOUNTER — Telehealth: Payer: Self-pay | Admitting: Gastroenterology

## 2019-02-27 ENCOUNTER — Telehealth: Payer: Self-pay | Admitting: *Deleted

## 2019-02-27 NOTE — Telephone Encounter (Signed)
Pt wants to cancel his procedure with SF on 6/26 and will reschedule after the covid19 passes.

## 2019-02-27 NOTE — Telephone Encounter (Signed)
LMOVM for endo scheduler to cancel procedure. FYI to EG.

## 2019-02-27 NOTE — Telephone Encounter (Signed)
Called pt to schedule his COVID screening but he said that he would have to call me back.  He said that he had to discuss this with his employer and would call me back today.

## 2019-02-27 NOTE — Telephone Encounter (Signed)
Noted  

## 2019-03-09 ENCOUNTER — Encounter (HOSPITAL_COMMUNITY): Payer: Self-pay

## 2019-03-09 ENCOUNTER — Ambulatory Visit (HOSPITAL_COMMUNITY): Admit: 2019-03-09 | Payer: Self-pay | Admitting: Gastroenterology

## 2019-03-09 SURGERY — COLONOSCOPY
Anesthesia: Moderate Sedation

## 2019-04-05 ENCOUNTER — Telehealth (HOSPITAL_COMMUNITY): Payer: Self-pay | Admitting: *Deleted

## 2019-04-05 NOTE — Telephone Encounter (Signed)
Patient approved for Multaq patient assistance through 04/03/2020.

## 2019-04-26 ENCOUNTER — Other Ambulatory Visit: Payer: Self-pay | Admitting: Cardiovascular Disease

## 2019-07-09 NOTE — Progress Notes (Signed)
Patient ID: [name], male   DOB: 1961-03-26, 58 y.o.   MRN: [name]     58 y.o. f/u CAD.  SEMI 2006 with DES to RCA. Cath in 2015 with patent stent no left sided disease. 2012 had episode of atrial flutter. Spontaneously converted and was only on coumadin briefly. Been on Multaq since but hard to afford.  He is self employed [name], quantity    His two boys help out  No real break through PAF   He has some morning nausea when he smells breakfast food No abdominal pain Has been on glucophage for a long Time and takes his niacin at night CT abdomen in 2015 no inflammation but with some gallstones   No chest pain BP elevated in office today   ROS: Denies fever, malais, weight loss, blurry vision, decreased visual acuity, cough, sputum, SOB, hemoptysis, pleuritic pain, palpitaitons, heartburn, abdominal pain, melena, lower extremity edema, claudication, or rash.  All other systems reviewed and negative  General: BP (!) 178/94   Pulse 68   Temp (!) 96.9 F (36.1 C) (Temporal)   Ht 6' (1.829 m)   Wt 249 lb (112.9 kg)   SpO2 98%   BMI 33.77 kg/m  Affect appropriate Healthy:  appears stated age HEENT: normal Neck supple with no adenopathy JVP normal no bruits no thyromegaly Lungs clear with no wheezing and good diaphragmatic motion Heart:  S1/S2 no murmur, no rub, gallop or click PMI normal Abdomen: benighn, BS positve, no tenderness, no AAA no bruit.  No HSM or HJR Distal pulses intact with no bruits No edema Neuro non-focal Skin warm and dry No muscular weakness    Current Outpatient Medications  Medication Sig Dispense Refill  . aspirin EC 81 MG tablet Take 81 mg by mouth daily.    . cetirizine (ZYRTEC) 10 MG tablet Take 10 mg by mouth daily as needed for allergies.    . Cinnamon 500 MG TABS Take 1,000 mg by mouth 2 (two) times daily.     . clopidogrel (PLAVIX) 75 MG tablet TAKE ONE TABLET BY MOUTH ONCE DAILY WITH BREAKFAST. 90 tablet 2  . CRESTOR 20 MG  tablet Take 20 mg by mouth every evening.     . hydrocortisone (ANUSOL-HC) 2.5 % rectal cream Place 1 application rectally 2 (two) times daily. Use up to twice a day for up to 10 days at a time 30 g 1  . metFORMIN (GLUCOPHAGE) 1000 MG tablet Take 1,000 mg by mouth 2 (two) times daily with a meal.    . metoprolol succinate (TOPROL-XL) 100 MG 24 hr tablet Take 1 tablet (100 mg total) by mouth daily. Take with or immediately following a meal. 30 tablet 0  . MULTAQ 400 MG tablet TAKE (1) TABLET BY MOUTH TWICE DAILY WITH A MEAL. 60 tablet 9  . niacin (NIASPAN) 1000 MG CR tablet Take 1,000 mg by mouth at bedtime.     . nitroGLYCERIN (NITROSTAT) 0.4 MG SL tablet Place 1 tablet (0.4 mg total) under the tongue every 5 (five) minutes as needed for chest pain. 25 tablet 3  . Omega-3 Fatty Acids (FISH OIL) 1200 MG CAPS Take 1 capsule by mouth 2 (two) times daily.    . polyethylene glycol-electrolytes (TRILYTE) 420 g solution Take 4,000 mLs by mouth as directed. 4000 mL 0  . losartan ([name]) 25 MG tablet Take 1 tablet (25 mg total) by mouth daily. 90 tablet 3   No current facility-administered medications for this visit.  Allergies  Patient has no known allergies.  Electrocardiogram:  2014  SR rate 55 normal QT 420 06/27/14  SR low voltage in inferior leads 10.9.18 NSR normal ECG 08/04/18 SR rate 56 isolated Q in lead 3  Assessment and Plan  CAD:  Stable with no angina and good activity level.  Continue medical Rx with ASA/Plavix and beta blocker Stent to RCA in 2012 patent by cath in 2015  Aflutter:  maint NSR quiescent  Not interested in cost of hospitalization to start sotolol or have ablation  He gets samples of Multaq from afib clinic on occasion   Chol:  Continue niacin and statin recent labs with primary [name] Lab Results  Component Value Date   [facility] 34 01/24/2013  recent labs with Dr [name]   DM:  Discussed low carb diet.  Target hemoglobin A1c is 6.5 or less.  Continue current  medications.  Nausea:  In am with breakfast smells CT Abdomen 09/14/13 gallstones no inflammation f/u GI primary   HTN:  BP elevated In setting of DM will start [name] 25 mg daily f/u with primary   [name]

## 2019-07-16 ENCOUNTER — Other Ambulatory Visit: Payer: Self-pay

## 2019-07-16 ENCOUNTER — Encounter: Payer: Self-pay | Admitting: Cardiovascular Disease

## 2019-07-16 ENCOUNTER — Ambulatory Visit (INDEPENDENT_AMBULATORY_CARE_PROVIDER_SITE_OTHER): Payer: Self-pay | Admitting: Cardiovascular Disease

## 2019-07-16 VITALS — BP 178/94 | HR 68 | Temp 96.9°F | Ht 72.0 in | Wt 249.0 lb

## 2019-07-16 DIAGNOSIS — I10 Essential (primary) hypertension: Secondary | ICD-10-CM

## 2019-07-16 DIAGNOSIS — I2510 Atherosclerotic heart disease of native coronary artery without angina pectoris: Secondary | ICD-10-CM

## 2019-07-16 MED ORDER — LOSARTAN POTASSIUM 25 MG PO TABS: 25.0000 mg | ORAL_TABLET | Freq: Every day | ORAL | 3 refills | Status: DC

## 2019-07-16 NOTE — Patient Instructions (Signed)
Medication Instructions:  START COZAAR 25 MG DAILY   Labwork: NONE  Testing/Procedures: NONE  Follow-Up: Your physician recommends that you schedule a follow-up appointment in: 3 MONTHS   Any Other Special Instructions Will Be Listed Below (If Applicable).     If you need a refill on your cardiac medications before your next appointment, please call your pharmacy.

## 2019-08-30 ENCOUNTER — Other Ambulatory Visit: Payer: Self-pay | Admitting: Cardiovascular Disease

## 2019-10-10 NOTE — Progress Notes (Signed)
Cardiology Office Note    Date:  10/11/2019   ID:  [name], DOB 1961/03/30, MRN [id number]  PCP:  Celene Squibb, MD  Cardiologist: Jenkins Rouge, MD    Chief Complaint  Patient presents with  . Follow-up    3 month visit    History of Present Illness:    [name] is a 59 y.o. male with past medical history of CAD (s/p DES to RCA in 2006, patent stent by repeat cath in 2015), history of remote atrial flutter (occurring in 2012 and no longer on anticoagulation - remains on Multaq), HTN, HLD, and Type 2 DM who presents to the office today for 30-month follow-up.  He was last examined by Dr. Johnsie Cancel in 07/2019 and denied any recent chest pain but did report some nausea in the morning hours. BP was elevated to 178/94 and he was started on Losartan 25 mg daily.  In talking with the patient today, he reports having worsening dyspnea on exertion for the past 1-2 months. Notices this while at work or when doing chores around the house. Denies any associated chest pain or palpitations. No recent orthopnea, PND or lower extremity edema. Says his prior anginal symptom in 2006 was mostly dizziness and worsening balance issues.   He reports he was previously walking regularly for exercise but has not done so in the past 6+ months. He does not have a BP cuff at home but BP is at 148/91 during today's visit.   Past Medical History:  Diagnosis Date  . Atrial flutter ([place])    a. Dx 12/2010 - briefly on coumadin in 2012, CHADS2 = 2/CHA2DS2VASc = 3;  b. No recurrence, on Multaq;  c. 08/2012 Echo: EF 55-60%.  [name] CAD (coronary artery disease)    a. 01/2005 NSTEMI/PCI: LM nl, LAD nl, RI 20 ost, LCX nl, RCA 30p/42m (3.5x23 Cypher DES), EF 60%  . Diabetes mellitus, type II ([place])   . HTN (hypertension)   . Hyperlipidemia   . Nephrolithiasis    a. s/p lithotripsy  . Obesity   . Ureteral stone 10/01/2013    Past Surgical History:  Procedure Laterality Date  .  stents  2006   cardiac stents  .  CARDIAC CATHETERIZATION  01/13/2005   LEFT HEART  . carpel tunnel Bilateral    [name]  . LEFT HEART CATHETERIZATION  05/12/2007  . LEFT HEART CATHETERIZATION WITH CORONARY ANGIOGRAM N/A 06/27/2014   Procedure: LEFT HEART CATHETERIZATION WITH CORONARY ANGIOGRAM;  Surgeon: [name], MD;  Location: [facility] C[facility] LAB;  Service: Cardiovascular;  Laterality: N/A;    Current Medications: Outpatient Medications Prior to Visit  Medication Sig Dispense Refill  . aspirin EC 81 MG tablet Take 81 mg by mouth daily.    . cetirizine (ZYRTEC) 10 MG tablet Take 10 mg by mouth daily as needed for allergies.    . Cinnamon 500 MG TABS Take 1,000 mg by mouth 2 (two) times daily.     . clopidogrel (PLAVIX) 75 MG tablet TAKE ONE TABLET BY MOUTH ONCE DAILY WITH BREAKFAST. 90 tablet 2  . CRESTOR 20 MG tablet Take 20 mg by mouth every evening.     . hydrocortisone (ANUSOL-HC) 2.5 % rectal cream Place 1 application rectally 2 (two) times daily. Use up to twice a day for up to 10 days at a time 30 g 1  . losartan (COZAAR) 25 MG tablet Take 1 tablet (25 mg total) by mouth daily. 90 tablet 3  .  metFORMIN (GLUCOPHAGE) 1000 MG tablet Take 1,000 mg by mouth 2 (two) times daily with a meal.    . metoprolol succinate (TOPROL-XL) 100 MG 24 hr tablet TAKE 1 TABLET BY MOUTH ONCE DAILY. 90 tablet 3  . MULTAQ 400 MG tablet TAKE (1) TABLET BY MOUTH TWICE DAILY WITH A MEAL. 60 tablet 9  . niacin (NIASPAN) 1000 MG CR tablet Take 1,000 mg by mouth at bedtime.     . nitroGLYCERIN (NITROSTAT) 0.4 MG SL tablet Place 1 tablet (0.4 mg total) under the tongue every 5 (five) minutes as needed for chest pain. 25 tablet 3  . Omega-3 Fatty Acids (FISH OIL) 1200 MG CAPS Take 1 capsule by mouth 2 (two) times daily.    . polyethylene glycol-electrolytes (TRILYTE) 420 g solution Take 4,000 mLs by mouth as directed. 4000 mL 0   No facility-administered medications prior to visit.     Allergies:   Patient has no known allergies.   Social  History   Socioeconomic History  . Marital status: Married    Spouse name: Not on file  . Number of children: Not on file  . Years of education: Not on file  . Highest education level: Not on file  Occupational History  . Not on file  Tobacco Use  . Smoking status: Former Smoker    Years: 8.00    Types: Cigarettes    Quit date: 09/22/2004    Years since quitting: 15.0  . Smokeless tobacco: Never Used  Substance and Sexual Activity  . Alcohol use: No    Alcohol/week: 0.0 standard drinks  . Drug use: No  . Sexual activity: Yes  Other Topics Concern  . Not on file  Social History Narrative   Lives with wife in [place].  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   Social Determinants of Health   Financial Resource Strain:   . Difficulty of Paying Living Expenses: Not on file  Food Insecurity:   . Worried About Charity fundraiser in the Last Year: Not on file  . Ran Out of Food in the Last Year: Not on file  Transportation Needs:   . Lack of Transportation (Medical): Not on file  . Lack of Transportation (Non-Medical): Not on file  Physical Activity:   . Days of Exercise per Week: Not on file  . Minutes of Exercise per Session: Not on file  Stress:   . Feeling of Stress : Not on file  Social Connections:   . Frequency of Communication with Friends and Family: Not on file  . Frequency of Social Gatherings with Friends and Family: Not on file  . Attends Religious Services: Not on file  . Active Member of Clubs or Organizations: Not on file  . Attends Archivist Meetings: Not on file  . Marital Status: Not on file     Family History:  The patient's family history includes Hypertension in his mother.   Review of Systems:   Please see the history of present illness.     General:  No chills, fever, night sweats or weight changes.  Cardiovascular:  No chest pain, edema, orthopnea, palpitations, paroxysmal nocturnal dyspnea.  Positive for dyspnea on exertion.  Dermatological: No rash, lesions/masses Respiratory: No cough, dyspnea Urologic: No hematuria, dysuria Abdominal:   No nausea, vomiting, diarrhea, bright red blood per rectum, melena, or hematemesis Neurologic:  No visual changes, wkns, changes in mental status. All other systems reviewed and are  otherwise negative except as noted above.   Physical Exam:    VS:  BP (!) 148/91   Pulse 74   Temp 98.7 F (37.1 C)   Ht 6' (1.829 m)   Wt 253 lb (114.8 kg)   SpO2 97%   BMI 34.31 kg/m    General: Well developed, well nourished,male appearing in no acute distress. Head: Normocephalic, atraumatic, sclera non-icteric, no xanthomas, nares are without discharge.  Neck: No carotid bruits. JVD not elevated.  Lungs: Respirations regular and unlabored, without wheezes or rales.  Heart: Regular rate and rhythm. No S3 or S4.  No murmur, no rubs, or gallops appreciated. Abdomen: Soft, non-tender, non-distended with normoactive bowel sounds. No hepatomegaly. No rebound/guarding. No obvious abdominal masses. Msk:  Strength and tone appear normal for age. No joint deformities or effusions. Extremities: No clubbing or cyanosis. No lower extremity edema.  Distal pedal pulses are 2+ bilaterally. Neuro: Alert and oriented X 3. Moves all extremities spontaneously. No focal deficits noted. Psych:  Responds to questions appropriately with a normal affect. Skin: No rashes or lesions noted  Wt Readings from Last 3 Encounters:  10/11/19 253 lb (114.8 kg)  07/16/19 249 lb (112.9 kg)  09/22/18 241 lb 6.4 oz (109.5 kg)     Studies/Labs Reviewed:   EKG:  EKG is ordered today.  The ekg ordered today demonstrates NSR, HR 70 with no acute ST changes when compared to prior tracings.   Recent Labs: No results found for requested labs within last 8760 hours.   Lipid Panel    Component Value Date/Time   CHOL 90 01/24/2013 0732   TRIG 129 01/24/2013 0732   HDL 30 (L)  01/24/2013 0732   CHOLHDL 3.0 01/24/2013 0732   VLDL 26 01/24/2013 0732   LDLCALC 34 01/24/2013 0732    Additional studies/ records that were reviewed today include:   Cardiac Catheterization: 06/2014 Coronary angiography: Coronary dominance: right  Left mainstem:  Normal  Left anterior descending (LAD):  Normal                          D1: Normal             D2 Small and normal  Left circumflex (LCx):  Codominant  Normal              OM1: normal             OM2: normal             OM3 normal  Right coronary artery (RCA):  30% tubular proximal  30% diffuse instent restenosis in mid vessel                PDA:  Small and normal             PLA:  Small and normal  Left ventriculography: Left ventricular systolic function is normal, LVEF is estimated at 55-65%, there is no significant mitral regurgitation    Recommendations:  No stenotic lesions Stent patent  No new left sided lesions  Continue medical Rx  Assessment:    1. Coronary artery disease involving native coronary artery of native heart with other form of angina pectoris ([place])   2. DOE (dyspnea on exertion)   3. History of atrial flutter   4. Essential hypertension   5. Hyperlipidemia LDL goal <70      Plan:   In order of problems listed above:  1. CAD/Dyspnea on Exertion - he  is s/p DES to RCA in 2006 with patent stent by repeat cath in 2015. - he reports worsening dyspnea on exertion for the past 2 months.No associated chest pain or palpitations. EKG today shows NSR with no acute ST abnormalities. Reviewed options with the patient and will plan to obtain a Lexiscan Myoview to rule-out significant ischemia. If no significant findings, I encouraged him to resume his exercise regimen as deconditioning could be playing a role in his symptoms.  - continue ASA, Plavix, BB and statin therapy.   2. History of Atrial Flutter - he denies any recent palpitations and is in NSR by examination and EKG today.  Continue current regimen with Toprol-XL 100mg  daily and Multaq 400mg  BID. No longer on anticoagulation given no recent occurrence.   3. HTN - BP improved from his last visit but remains above goal as it is at 148/91 during today's visit. He does not have a BP cuff at home. Will recheck with stress testing next week. If BP remains elevated, would titrate Losartan from 25mg  daily to 50mg  daily with plans for a repeat BMET in 2 weeks following initiation. Continue Toprol-XL 100mg  daily.   4. HLD - followed by PCP. Will request most recent labs. Goal LDL is less than 70 with known CAD. Continue Crestor 20mg  daily.   Medication Adjustments/Labs and Tests Ordered: Current medicines are reviewed at length with the patient today.  Concerns regarding medicines are outlined above.  Medication changes, Labs and Tests ordered today are listed in the Patient Instructions below. Patient Instructions  Medication Instructions:  Your physician recommends that you continue on your current medications as directed. Please refer to the Current Medication list given to you today.  *If you need a refill on your cardiac medications before your next appointment, please call your pharmacy*  Lab Work: None If you have labs (blood work) drawn today and your tests are completely normal, you will receive your results only by: [name] MyChart Message (if you have MyChart) OR . A paper copy in the mail If you have any lab test that is abnormal or we need to change your treatment, we will call you to review the results.  Testing/Procedures: Your physician has requested that you have a lexiscan myoview. For further information please visit [email or web address]. Please follow instruction sheet, as given.    Follow-Up: At [facility], you and your health needs are our priority.  As part of our continuing mission to provide you with exceptional heart care, we have created designated Provider Care Teams.  These Care Teams  include your primary Cardiologist (physician) and Advanced Practice Providers (APPs -  Physician Assistants and Nurse Practitioners) who all work together to provide you with the care you need, when you need it.  Your next appointment:   3 month(s)  The format for your next appointment:   In Person  Provider:   Jenkins Rouge, MD, or Bernerd Pho, PA-C  Other Instructions None   Thank you for choosing [facility] !        Signed, Erma Heritage, PA-C  10/11/2019 8:12 PM    [place] [address] [place], [place] [address] Phone: [phone] Fax: [phone]

## 2019-10-11 ENCOUNTER — Ambulatory Visit (INDEPENDENT_AMBULATORY_CARE_PROVIDER_SITE_OTHER): Payer: Self-pay | Admitting: Student

## 2019-10-11 ENCOUNTER — Encounter: Payer: Self-pay | Admitting: Student

## 2019-10-11 ENCOUNTER — Other Ambulatory Visit: Payer: Self-pay

## 2019-10-11 VITALS — BP 148/91 | HR 74 | Temp 98.7°F | Ht 72.0 in | Wt 253.0 lb

## 2019-10-11 DIAGNOSIS — I25118 Atherosclerotic heart disease of native coronary artery with other forms of angina pectoris: Secondary | ICD-10-CM

## 2019-10-11 DIAGNOSIS — E785 Hyperlipidemia, unspecified: Secondary | ICD-10-CM

## 2019-10-11 DIAGNOSIS — R0609 Other forms of dyspnea: Secondary | ICD-10-CM

## 2019-10-11 DIAGNOSIS — I10 Essential (primary) hypertension: Secondary | ICD-10-CM

## 2019-10-11 DIAGNOSIS — Z8679 Personal history of other diseases of the circulatory system: Secondary | ICD-10-CM

## 2019-10-11 DIAGNOSIS — R0600 Dyspnea, unspecified: Secondary | ICD-10-CM

## 2019-10-11 NOTE — Patient Instructions (Signed)
Medication Instructions:  Your physician recommends that you continue on your current medications as directed. Please refer to the Current Medication list given to you today.  *If you need a refill on your cardiac medications before your next appointment, please call your pharmacy*  Lab Work: None If you have labs (blood work) drawn today and your tests are completely normal, you will receive your results only by: [name] MyChart Message (if you have MyChart) OR . A paper copy in the mail If you have any lab test that is abnormal or we need to change your treatment, we will call you to review the results.  Testing/Procedures: Your physician has requested that you have a lexiscan myoview. For further information please visit [email or web address]. Please follow instruction sheet, as given.    Follow-Up: At [facility], you and your health needs are our priority.  As part of our continuing mission to provide you with exceptional heart care, we have created designated Provider Care Teams.  These Care Teams include your primary Cardiologist (physician) and Advanced Practice Providers (APPs -  Physician Assistants and Nurse Practitioners) who all work together to provide you with the care you need, when you need it.  Your next appointment:   3 month(s)  The format for your next appointment:   In Person  Provider:   Jenkins Rouge, MD, or Bernerd Pho, PA-C  Other Instructions None     Thank you for choosing [facility] !

## 2019-10-16 ENCOUNTER — Encounter (HOSPITAL_COMMUNITY): Payer: Self-pay

## 2019-10-16 ENCOUNTER — Other Ambulatory Visit: Payer: Self-pay

## 2019-10-16 ENCOUNTER — Other Ambulatory Visit: Payer: Self-pay | Admitting: Cardiovascular Disease

## 2019-10-16 ENCOUNTER — Other Ambulatory Visit: Payer: Self-pay | Admitting: Student

## 2019-10-16 ENCOUNTER — Encounter (HOSPITAL_COMMUNITY)
Admission: RE | Admit: 2019-10-16 | Discharge: 2019-10-16 | Disposition: A | Discharge status: Home / Self Care | Payer: Self-pay | Source: Ambulatory Visit | Attending: Nurse Practitioner | Admitting: Nurse Practitioner

## 2019-10-16 ENCOUNTER — Encounter (HOSPITAL_BASED_OUTPATIENT_CLINIC_OR_DEPARTMENT_OTHER)
Admission: RE | Admit: 2019-10-16 | Discharge: 2019-10-16 | Disposition: A | Discharge status: Home / Self Care | Payer: Self-pay | Source: Ambulatory Visit | Attending: Student | Admitting: Student

## 2019-10-16 DIAGNOSIS — R0600 Dyspnea, unspecified: Secondary | ICD-10-CM | POA: Insufficient documentation

## 2019-10-16 DIAGNOSIS — R0609 Other forms of dyspnea: Secondary | ICD-10-CM

## 2019-10-16 HISTORY — DX: Heart failure, unspecified: I50.9

## 2019-10-16 LAB — NM MYOCAR MULTI W/SPECT W/WALL MOTION / EF
LV dias vol: 119 mL (ref 62–150)
LV sys vol: 56 mL
Peak HR: 86 {beats}/min
RATE: 0.32
Rest HR: 59 {beats}/min
SDS: 0
SRS: 0
SSS: 0
TID: 1.36

## 2019-10-16 MED ORDER — TECHNETIUM TC 99M TETROFOSMIN IV KIT
30.0000 | PACK | Freq: Once | INTRAVENOUS | Status: AC | PRN
  Administered 2019-10-16: 29.6 via INTRAVENOUS

## 2019-10-16 MED ORDER — SODIUM CHLORIDE FLUSH 0.9 % IV SOLN
INTRAVENOUS | Status: AC
  Administered 2019-10-16: 10 mL via INTRAVENOUS
  Filled 2019-10-16: qty 10

## 2019-10-16 MED ORDER — NITROGLYCERIN 0.4 MG SL SUBL: 0.4000 mg | SUBLINGUAL_TABLET | SUBLINGUAL | 3 refills | Status: DC | PRN

## 2019-10-16 MED ORDER — TECHNETIUM TC 99M TETROFOSMIN IV KIT
10.0000 | PACK | Freq: Once | INTRAVENOUS | Status: AC | PRN
  Administered 2019-10-16: 08:00:00 10.4 via INTRAVENOUS

## 2019-10-16 MED ORDER — REGADENOSON 0.4 MG/5ML IV SOLN
INTRAVENOUS | Status: AC
  Administered 2019-10-16: 0.4 mg via INTRAVENOUS
  Filled 2019-10-16: qty 5

## 2019-10-17 ENCOUNTER — Telehealth: Payer: Self-pay

## 2019-10-17 ENCOUNTER — Telehealth: Payer: Self-pay | Admitting: Cardiovascular Disease

## 2019-10-17 MED ORDER — NITROGLYCERIN 0.4 MG SL SUBL: SUBLINGUAL_TABLET | SUBLINGUAL | 0 refills | Status: AC

## 2019-10-17 NOTE — Telephone Encounter (Signed)
Sent to [facility] on [address] on [address].

## 2019-10-17 NOTE — Telephone Encounter (Signed)
Medication was sent in earlier today. Pt made aware.

## 2019-10-17 NOTE — Telephone Encounter (Signed)
10-17-19/4:32pm Pt would like Nitrostat called to [facility]

## 2019-10-17 NOTE — Telephone Encounter (Signed)
Please send Rx for NITROSTAT 0.4 MG SL tablet [id number]  To [facility] so they can use the good Rx card

## 2019-10-31 ENCOUNTER — Other Ambulatory Visit: Payer: Self-pay | Admitting: Cardiovascular Disease

## 2019-12-01 ENCOUNTER — Other Ambulatory Visit: Payer: Self-pay | Admitting: Cardiovascular Disease

## 2019-12-03 ENCOUNTER — Telehealth: Payer: Self-pay | Admitting: Gastroenterology

## 2019-12-03 NOTE — Telephone Encounter (Signed)
CALLED PATIENT AND SCHEDULED HIM FOR AN OFFICE VISIT

## 2019-12-03 NOTE — Telephone Encounter (Signed)
Patient needs OV. Last seen 09/2018

## 2019-12-03 NOTE — Telephone Encounter (Signed)
error 

## 2019-12-03 NOTE — Telephone Encounter (Signed)
PATIENT CALLED WANTING TO NOW HAVE HIS TCS,  DOES HE NEED ANOTHER OFFICE VISIT?

## 2019-12-11 NOTE — Progress Notes (Signed)
Cardiology Office Note    Date:  12/14/2019   ID:  [name], DOB Nov 09, 1960, MRN [id number]  PCP:  Celene Squibb, MD  Cardiologist: Jenkins Rouge, MD    No chief complaint on file.   History of Present Illness:    [name] is a 59 y.o. 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   Walking more no angina   Has had COVID vaccine   Past Medical History:  Diagnosis Date  . Atrial flutter ([place])    a. Dx 12/2010 - briefly on coumadin in 2012, CHADS2 = 2/CHA2DS2VASc = 3;  b. No recurrence, on Multaq;  c. 08/2012 Echo: EF 55-60%.  [name] CAD (coronary artery disease)    a. 01/2005 NSTEMI/PCI: LM nl, LAD nl, RI 20 ost, LCX nl, RCA 30p/64m (3.5x23 Cypher DES), EF 60%  . CHF (congestive heart failure) ([place])   . Diabetes mellitus, type II ([place])   . HTN (hypertension)   . Hyperlipidemia   . Nephrolithiasis    a. s/p lithotripsy  . Obesity   . Ureteral stone 10/01/2013    Past Surgical History:  Procedure Laterality Date  .  stents  2006   cardiac stents  . CARDIAC CATHETERIZATION  01/13/2005   LEFT HEART  . carpel tunnel Bilateral    [name]  . LEFT HEART CATHETERIZATION  05/12/2007  . LEFT HEART CATHETERIZATION WITH CORONARY ANGIOGRAM N/A 06/27/2014   Procedure: LEFT HEART CATHETERIZATION WITH CORONARY ANGIOGRAM;  Surgeon: [name], MD;  Location: [facility] CATH LAB;  Service: Cardiovascular;  Laterality: N/A;    Current Medications: Outpatient Medications Prior to  Visit  Medication Sig Dispense Refill  . aspirin EC 81 MG tablet Take 81 mg by mouth daily.    . cetirizine (ZYRTEC) 10 MG tablet Take 10 mg by mouth daily as needed for allergies.    . Cinnamon 500 MG TABS Take 1,000 mg by mouth 2 (two) times daily.     . clopidogrel (PLAVIX) 75 MG tablet TAKE ONE TABLET BY MOUTH ONCE DAILY WITH BREAKFAST. 30 tablet 3  . CRESTOR 20 MG tablet Take 20 mg by mouth every evening.     . hydrocortisone (ANUSOL-HC) 2.5 % rectal cream Place 1 application rectally 2 (two) times daily. Use up to twice a day for up to 10 days at a time 30 g 1  . metFORMIN (GLUCOPHAGE) 1000 MG tablet Take 1,000 mg by mouth 2 (two) times daily with a meal.    . metoprolol succinate (TOPROL-XL) 100 MG 24 hr tablet TAKE 1 TABLET BY MOUTH ONCE DAILY. 90 tablet 3  . MULTAQ 400 MG tablet TAKE (1) TABLET BY MOUTH TWICE DAILY WITH A MEAL. 60 tablet 9  . niacin (NIASPAN) 1000 MG CR tablet Take 1,000 mg by mouth at bedtime.     . nitroGLYCERIN (NITROSTAT) 0.4 MG SL tablet PLACE ONE TBALET UNDER THE TONGUE EVERY  5 MINUTES AS NEEDED FOR CHEST PAIN. 25 tablet 0  . Omega-3 Fatty Acids (FISH OIL) 1200 MG CAPS Take 1 capsule by mouth 2 (two) times daily.    . polyethylene glycol-electrolytes (TRILYTE) 420 g solution Take 4,000 mLs by mouth as directed. 4000 mL 0  . losartan (COZAAR) 25 MG tablet Take 1 tablet (25 mg total) by mouth daily. 90 tablet 3   No facility-administered medications prior to visit.     Allergies:   Patient has no known allergies.   Social History   Socioeconomic History  . Marital status: Married    Spouse name: Not on file  . Number of children: Not on file  . Years of education: Not on file  . Highest education level: Not on file  Occupational History  . Not on file  Tobacco Use  . Smoking status: Former Smoker    Years: 8.00    Types: Cigarettes    Quit date: 09/22/2004    Years since quitting: 15.2  . Smokeless tobacco: Never Used  Substance and Sexual Activity   . Alcohol use: No    Alcohol/week: 0.0 standard drinks  . Drug use: No  . Sexual activity: Yes  Other Topics Concern  . Not on file  Social History Narrative   Lives with wife in [place].  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   Social Determinants of Health   Financial Resource Strain:   . Difficulty of Paying Living Expenses:   Food Insecurity:   . Worried About Charity fundraiser in the Last Year:   . Arboriculturist in the Last Year:   Transportation Needs:   . Film/video editor (Medical):   [name] Lack of Transportation (Non-Medical):   Physical Activity:   . Days of Exercise per Week:   . Minutes of Exercise per Session:   Stress:   . Feeling of Stress :   Social Connections:   . Frequency of Communication with Friends and Family:   . Frequency of Social Gatherings with Friends and Family:   . Attends Religious Services:   . Active Member of Clubs or Organizations:   . Attends Archivist Meetings:   [name] Marital Status:      Family History:  The patient's family history includes Hypertension in his mother.   Review of Systems:   Please see the history of present illness.     General:  No chills, fever, night sweats or weight changes.  Cardiovascular:  No chest pain, edema, orthopnea, palpitations, paroxysmal nocturnal dyspnea. Positive for dyspnea on exertion.  Dermatological: No rash, lesions/masses Respiratory: No cough, dyspnea Urologic: No hematuria, dysuria Abdominal:   No nausea, vomiting, diarrhea, bright red blood per rectum, melena, or hematemesis Neurologic:  No visual changes, wkns, changes in mental status. All other systems reviewed and are otherwise negative except as noted above.   Physical Exam:    Affect appropriate Healthy:  appears stated age 23: normal Neck supple with no adenopathy JVP normal no bruits no thyromegaly Lungs clear with no wheezing and good diaphragmatic motion Heart:   S1/S2 no murmur, no rub, gallop or click PMI normal Abdomen: benighn, BS positve, no tenderness, no AAA no bruit.  No HSM or HJR Distal pulses intact with no bruits No edema Neuro non-focal Skin warm and dry No muscular weakness   Wt Readings from Last 3 Encounters:  10/11/19 253 lb (114.8 kg)  07/16/19 249 lb (  112.9 kg)  09/22/18 241 lb 6.4 oz (109.5 kg)     Studies/Labs Reviewed:   EKG:  10/11/19  NSR, HR 70 with no acute ST changes when compared to prior tracings.   Recent Labs: No results found for requested labs within last 8760 hours.   Lipid Panel    Component Value Date/Time   CHOL 90 01/24/2013 0732   TRIG 129 01/24/2013 0732   HDL 30 (L) 01/24/2013 0732   CHOLHDL 3.0 01/24/2013 0732   VLDL 26 01/24/2013 0732   LDLCALC 34 01/24/2013 0732    Additional studies/ records that were reviewed today include:   Myovue:  10/16/19 Normal  No ischemia EF 53%   Cardiac Catheterization: 06/2014 Coronary angiography: Coronary dominance: right  Left mainstem:  Normal  Left anterior descending (LAD):  Normal                          D1: Normal             D2 Small and normal  Left circumflex (LCx):  Codominant  Normal              OM1: normal             OM2: normal             OM3 normal  Right coronary artery (RCA):  30% tubular proximal  30% diffuse instent restenosis in mid vessel                PDA:  Small and normal             PLA:  Small and normal  Left ventriculography: Left ventricular systolic function is normal, LVEF is estimated at 55-65%, there is no significant mitral regurgitation    Recommendations:  No stenotic lesions Stent patent  No new left sided lesions  Continue medical Rx  Assessment:    CAD  Plan:   In order of problems listed above:  1. CAD/Dyspnea on Exertion - he is s/p DES to RCA in 2006 with patent stent by repeat cath in 2015. - . Normal myovue 10/16/19 no ischemia EF 53% - continue ASA, Plavix, BB and  statin therapy.   2. History of Atrial Flutter - maintaining NSR Continue current regimen with Toprol-XL 100mg  daily and Multaq 400mg  BID. No longer on anticoagulation given no recent occurrence.   3. HTN - Well controlled.  Continue current medications and low sodium Dash type diet.     4. HLD - followed by PCP. Will request most recent labs. Goal LDL is less than 70 with known CAD. Continue Crestor 20mg  daily.      Signed, Jenkins Rouge, MD  12/14/2019 8:25 AM    [place] [address] [place], [place] [address] Phone: [phone] Fax: [phone]

## 2019-12-14 ENCOUNTER — Encounter: Payer: Self-pay | Admitting: Cardiovascular Disease

## 2019-12-14 ENCOUNTER — Ambulatory Visit (INDEPENDENT_AMBULATORY_CARE_PROVIDER_SITE_OTHER): Payer: Self-pay | Admitting: Cardiovascular Disease

## 2019-12-14 ENCOUNTER — Other Ambulatory Visit: Payer: Self-pay

## 2019-12-14 VITALS — BP 118/76 | HR 55 | Temp 97.5°F | Ht 72.0 in | Wt 250.0 lb

## 2019-12-14 DIAGNOSIS — I2510 Atherosclerotic heart disease of native coronary artery without angina pectoris: Secondary | ICD-10-CM

## 2019-12-14 NOTE — Patient Instructions (Signed)

## 2019-12-20 NOTE — Progress Notes (Signed)
Primary Care Physician:  Celene Squibb, MD  Referring Physician: Dr. Nevada Crane  Primary Gastroenterologist:  Dr. Oneida Alar   Chief Complaint  Patient presents with  . Consult    TCS never done prior    HPI:   [name] is a 59 y.o. male presenting today at the request of Dr. Nevada Crane for colonoscopy. No prior colonoscopy. He was last seen Jan 2020 and had been scheduled for colonoscopy; however, due to COVID-19, this was postponed.   History of chronic low-volume hematochezia. Has prolapsing of hemorrhoids and has to push back in. No rectal pain. Has to wear a pad at times.  No constipation or diarrhea. Has looser stool at times. No abdominal pain. No weight loss or lack of appetite. Rare reflux. No dysphagia. On Plavix.   Past Medical History:  Diagnosis Date  . Atrial flutter ([place])    a. Dx 12/2010 - briefly on coumadin in 2012, CHADS2 = 2/CHA2DS2VASc = 3;  b. No recurrence, on Multaq;  c. 08/2012 Echo: EF 55-60%.  [name] CAD (coronary artery disease)    a. 01/2005 NSTEMI/PCI: LM nl, LAD nl, RI 20 ost, LCX nl, RCA 30p/43m (3.5x23 Cypher DES), EF 60%  . CHF (congestive heart failure) ([place])   . Diabetes mellitus, type II ([place])   . HTN (hypertension)   . Hyperlipidemia   . Nephrolithiasis    a. s/p lithotripsy  . Obesity   . Ureteral stone 10/01/2013    Past Surgical History:  Procedure Laterality Date  .  stents  2006   cardiac stents  . CARDIAC CATHETERIZATION  01/13/2005   LEFT HEART  . carpel tunnel Bilateral    [name]  . LEFT HEART CATHETERIZATION  05/12/2007  . LEFT HEART CATHETERIZATION WITH CORONARY ANGIOGRAM N/A 06/27/2014   Procedure: LEFT HEART CATHETERIZATION WITH CORONARY ANGIOGRAM;  Surgeon: [name], MD;  Location: [facility] CATH LAB;  Service: Cardiovascular;  Laterality: N/A;    Current Outpatient Medications  Medication Sig Dispense Refill  . aspirin EC 81 MG tablet Take 81 mg by mouth daily.    . cetirizine (ZYRTEC) 10 MG tablet Take 10 mg by mouth daily  as needed for allergies.    . Cinnamon 500 MG TABS Take 1,000 mg by mouth 2 (two) times daily.     . clopidogrel (PLAVIX) 75 MG tablet TAKE ONE TABLET BY MOUTH ONCE DAILY WITH BREAKFAST. 30 tablet 3  . CRESTOR 20 MG tablet Take 20 mg by mouth every evening.     . hydrocortisone (ANUSOL-HC) 2.5 % rectal cream Place 1 application rectally 2 (two) times daily. Use up to twice a day for up to 10 days at a time 30 g 1  . losartan (COZAAR) 25 MG tablet Take 1 tablet (25 mg total) by mouth daily. 90 tablet 3  . metFORMIN (GLUCOPHAGE) 1000 MG tablet Take 1,000 mg by mouth 2 (two) times daily with a meal.    . metoprolol succinate (TOPROL-XL) 100 MG 24 hr tablet TAKE 1 TABLET BY MOUTH ONCE DAILY. 90 tablet 3  . MULTAQ 400 MG tablet TAKE (1) TABLET BY MOUTH TWICE DAILY WITH A MEAL. 60 tablet 9  . niacin (NIASPAN) 1000 MG CR tablet Take 1,000 mg by mouth at bedtime.     . nitroGLYCERIN (NITROSTAT) 0.4 MG SL tablet PLACE ONE TBALET UNDER THE TONGUE EVERY 5 MINUTES AS NEEDED FOR CHEST PAIN. 25 tablet 0  . Omega-3 Fatty Acids (FISH OIL) 1200 MG CAPS  Take 1 capsule by mouth 2 (two) times daily.     No current facility-administered medications for this visit.    Allergies as of 12/21/2019  . (No Known Allergies)    Family History  Problem Relation Age of Onset  . Hypertension Mother   . Colon cancer Neg Hx   . Colon polyps Neg Hx     Social History   Socioeconomic History  . Marital status: Married    Spouse name: Not on file  . Number of children: Not on file  . Years of education: Not on file  . Highest education level: Not on file  Occupational History  . Not on file  Tobacco Use  . Smoking status: Former Smoker    Years: 8.00    Types: Cigarettes    Quit date: 09/22/2004    Years since quitting: 15.2  . Smokeless tobacco: Never Used  Substance and Sexual Activity  . Alcohol use: No    Alcohol/week: 0.0 standard drinks  . Drug use: No  . Sexual activity: Yes  Other Topics Concern    . Not on file  Social History Narrative   Lives with wife in [place].  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   Social Determinants of Health   Financial Resource Strain:   . Difficulty of Paying Living Expenses:   Food Insecurity:   . Worried About Charity fundraiser in the Last Year:   . Arboriculturist in the Last Year:   Transportation Needs:   . Film/video editor (Medical):   [name] Lack of Transportation (Non-Medical):   Physical Activity:   . Days of Exercise per Week:   . Minutes of Exercise per Session:   Stress:   . Feeling of Stress :   Social Connections:   . Frequency of Communication with Friends and Family:   . Frequency of Social Gatherings with Friends and Family:   . Attends Religious Services:   . Active Member of Clubs or Organizations:   . Attends Archivist Meetings:   [name] Marital Status:   Intimate Partner Violence:   . Fear of Current or Ex-Partner:   . Emotionally Abused:   [name] Physically Abused:   . Sexually Abused:     Review of Systems: Gen: Denies any fever, chills, fatigue, weight loss, lack of appetite.  CV: Denies chest pain, heart palpitations, peripheral edema, syncope.  Resp: Denies shortness of breath at rest or with exertion. Denies wheezing or cough.  GI: see HPI GU : Denies urinary burning, urinary frequency, urinary hesitancy MS: Denies joint pain, muscle weakness, cramps, or limitation of movement.  Derm: Denies rash, itching, dry skin Psych: Denies depression, anxiety, memory loss, and confusion Heme: see HPI  Physical Exam: BP (!) 158/89   Pulse 68   Temp (!) 97.1 F (36.2 C) (Oral)   Ht 6' (1.829 m)   Wt 251 lb 6.4 oz (114 kg)   BMI 34.10 kg/m  General:   Alert and oriented. Pleasant and cooperative. Well-nourished and well-developed.  Head:  Normocephalic and atraumatic. Eyes:  Without icterus, sclera clear and conjunctiva pink.  Ears:  Normal auditory acuity. Mouth:   Mask in place Lungs:  Clear to auscultation bilaterally. No wheezes, rales, or rhonchi. No distress.  Heart:  S1, S2 present without murmurs appreciated.  Abdomen:  +BS, soft, non-tender and non-distended. No HSM noted. No guarding or rebound. No masses appreciated.  Rectal:  Deferred  Msk:  Symmetrical without gross deformities. Normal posture. Extremities:  Without edema. Neurologic:  Alert and  oriented x4;  grossly normal neurologically. Psych:  Alert and cooperative. Normal mood and affect.  ASSESSMENT: [name] is a 59 y.o. male presenting today with history of chronic rectal bleeding likely due to hemorrhoids; however, he has never had a colonoscopy. Previously was arranged last year but postponed due to COVID-19. He desires hemorrhoid banding, but we discussed that as he is on Plavix, this places him at much higher risk of bleeding and with history of aflutter (although controlled), increased risk of stroke off Plavix. He is eager to have treatment for hemorrhoids. To be thorough and at patient's request, I did discuss with cardiology feasibility of holding Plavix for 10 days, which would be necessary if banding performed, and this is not advisable. We have updated patient regarding this. Will need surgical referral if significant hemorrhoids.    PLAN:  Proceed with TCS with Dr. Gala Romney in near future: the risks, benefits, and alternatives have been discussed with the patient in detail. The patient states understanding and desires to proceed.  Further recommendations to follow.  Not an outpatient banding candidate and anticipate surgical referral following colonoscopy   Annitta Needs, PhD, ANP-BC [facility]

## 2019-12-21 ENCOUNTER — Other Ambulatory Visit: Payer: Self-pay

## 2019-12-21 ENCOUNTER — Telehealth: Payer: Self-pay | Admitting: Emergency Medicine

## 2019-12-21 ENCOUNTER — Encounter: Payer: Self-pay | Admitting: Gastroenterology

## 2019-12-21 ENCOUNTER — Ambulatory Visit (INDEPENDENT_AMBULATORY_CARE_PROVIDER_SITE_OTHER): Payer: Self-pay | Admitting: Gastroenterology

## 2019-12-21 VITALS — BP 158/89 | HR 68 | Temp 97.1°F | Ht 72.0 in | Wt 251.4 lb

## 2019-12-21 DIAGNOSIS — K625 Hemorrhage of anus and rectum: Secondary | ICD-10-CM

## 2019-12-21 NOTE — Telephone Encounter (Signed)
[facility] clinical pool: please arrange TCS with Dr. Gala Romney. Not a candidate for banding as on Plavix.   Please let patient know we will pursue colonoscopy first, and banding at time of procedure/outpatient as well is not feasible at this time. Further recommendations after colonoscopy.

## 2019-12-21 NOTE — Telephone Encounter (Signed)
-----   Message from [name], MD sent at 12/21/2019  9:10 AM EDT ----- Regarding: RE: clearance Ok to hold plavix for 5 days and have procedure ----- Message ----- From: Luanne Bras, CMA Sent: 12/21/2019   9:04 AM EDT To: [name], MD Subject: clearance                                       Our provider Roseanne Kaufman would like to get cardiac clearance on above patient for a TCS and possible banding  with Doctor Oneida Alar. We would need to hold Plavix the day of the procedure and 10 days after that. Is this ok?

## 2019-12-21 NOTE — Telephone Encounter (Signed)
?   No info on note forwarded

## 2019-12-21 NOTE — Telephone Encounter (Signed)
Noted. At this time, hold off on banding in setting of Plavix, as he would need to be off for a prolonged time. Risks will likely outweigh benefits. Await colonoscopy and then likely refer to Surgery for evaluation.

## 2019-12-21 NOTE — Patient Instructions (Addendum)
We are arranging a colonoscopy in the near future. If Dr. Oneida Alar is unable to do it, Dr. Gala Romney will do it.  You may not be a candidate for banding, as Plavix would have to be held for an extended amount of time. We will be reaching out to cardiology just in case this is feasible.  Do not take metformin the day of the procedure.  Further recommendations to follow!  It was a pleasure to see you today. I want to create trusting relationships with patients to provide genuine, compassionate, and quality care. I value your feedback. If you receive a survey regarding your visit,  I greatly appreciate you taking time to fill this out.   Annitta Needs, PhD, ANP-BC [facility]

## 2019-12-24 NOTE — Telephone Encounter (Signed)
noted 

## 2019-12-24 NOTE — Telephone Encounter (Signed)
He could hold plavix for 5 days for colonoscopy/banding longer if needed stent a long time ago and recent non ischemic myovue

## 2019-12-24 NOTE — Progress Notes (Signed)
CC'ED TO PCP 

## 2019-12-24 NOTE — Telephone Encounter (Signed)
Called and informed pt of AB's recommendation. He wants to hold off on everything for right now. He will call office back.

## 2019-12-24 NOTE — Telephone Encounter (Signed)
See other phone encounter from 12/21/19 with GI.

## 2020-01-30 ENCOUNTER — Telehealth: Payer: Self-pay | Admitting: Emergency Medicine

## 2020-01-30 NOTE — Telephone Encounter (Signed)
-----   Message from [name], MD sent at 12/21/2019  9:10 AM EDT ----- Regarding: RE: clearance Ok to hold plavix for 5 days and have procedure ----- Message ----- From: Luanne Bras, CMA Sent: 12/21/2019   9:04 AM EDT To: [name], MD Subject: clearance                                       Our provider Roseanne Kaufman would like to get cardiac clearance on above patient for a TCS and possible banding  with Doctor Oneida Alar. We would need to hold Plavix the day of the procedure and 10 days after that. Is this ok?

## 2020-02-07 ENCOUNTER — Telehealth (HOSPITAL_COMMUNITY): Payer: Self-pay | Admitting: *Deleted

## 2020-02-07 NOTE — Telephone Encounter (Signed)
Patient approved for Multaq patient assistance through 02/04/21.

## 2020-03-04 ENCOUNTER — Telehealth: Payer: Self-pay | Admitting: Cardiovascular Disease

## 2020-03-04 NOTE — Telephone Encounter (Signed)
I s/w [name] with [facility] who confirms to be 1 tooth extracted at this time. Pt is on ASA and Plavix as well. [name] states will leave up to cardiologist if these medications need to be held and if for how long. I will send updated notes to pre op.

## 2020-03-04 NOTE — Telephone Encounter (Signed)
   [facility] Pre-operative Risk Assessment    Request for surgical clearance:  1. What type of surgery is being performed? Tooth Extraction     2. When is this surgery scheduled? TBD  3. What type of clearance is required (medical clearance vs. Pharmacy clearance to hold med vs. Both)? Pharmacy   4. Are there any medications that need to be held prior to surgery and how long? Blood thinner   5. Practice name and name of physician performing surgery? Dr [facility]   6. What is your office phone number? [phone]   7.   What is your office fax number? [phone]  8.   Anesthesia type (None, local, MAC, general) ? Numbing    [name] [place] 03/04/2020, 3:09 PM  _________________________________________________________________   (provider comments below)

## 2020-03-05 ENCOUNTER — Other Ambulatory Visit: Payer: Self-pay | Admitting: Cardiovascular Disease

## 2020-03-05 NOTE — Telephone Encounter (Signed)
It is also generally accepted that for simple extractions and dental cleanings, there is no need to interrupt blood thinner therapy.  I will route this recommendation to the requesting party via Epic fax function and remove from pre-op pool.  Please call with questions.  [place], [place] 03/05/2020, 3:33 PM

## 2020-03-05 NOTE — Telephone Encounter (Signed)
That is up to the dentist not me Ok to if needed

## 2020-03-05 NOTE — Telephone Encounter (Signed)
This is a [place] pt.  °

## 2020-03-05 NOTE — Telephone Encounter (Signed)
Transferred call to [place].

## 2020-03-05 NOTE — Telephone Encounter (Signed)
   Primary Cardiologist: Jenkins Rouge, MD  Chart reviewed as part of pre-operative protocol coverage.   Simple dental extractions are considered low risk procedures per guidelines and generally do not require any specific cardiac clearance.   Hx of CAD (s/p DES to RCA in 2006, patent stent by repeat cath in 2015), history of remote atrial flutter (occurring in 2012 and no longer on anticoagulation - remains on Multaq), HTN, HLD, and Type 2 DM.  No ischemic myoview 10/16/19.   Does patient need to hold ASA and Plavix for simple tooth extraction?  [place], [place] 03/05/2020, 8:25 AM

## 2020-09-01 ENCOUNTER — Other Ambulatory Visit: Payer: Self-pay | Admitting: Cardiovascular Disease

## 2020-09-22 ENCOUNTER — Other Ambulatory Visit: Payer: Self-pay | Admitting: Cardiovascular Disease

## 2020-10-24 NOTE — Progress Notes (Signed)
Cardiology Office Note    Date:  11/03/2020   ID:  [name], DOB 1961-03-05, MRN [id number]  PCP:  Celene Squibb, MD  Cardiologist: Jenkins Rouge, MD    No chief complaint on file.   History of Present Illness:    [name] is a 60 y.o. 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   Walking more no angina   Has had COVID vaccine Had COVID a month ago doing fine now  Needs GI f/u for Hemorid and colonoscopy   Compliant with meds no PAF/Chest pain   Past Medical History:  Diagnosis Date  . Atrial flutter ([place])    a. Dx 12/2010 - briefly on coumadin in 2012, CHADS2 = 2/CHA2DS2VASc = 3;  b. No recurrence, on Multaq;  c. 08/2012 Echo: EF 55-60%.  [name] CAD (coronary artery disease)    a. 01/2005 NSTEMI/PCI: LM nl, LAD nl, RI 20 ost, LCX nl, RCA 30p/36m (3.5x23 Cypher DES), EF 60%  . CHF (congestive heart failure) ([place])   . Diabetes mellitus, type II ([place])   . HTN (hypertension)   . Hyperlipidemia   . Nephrolithiasis    a. s/p lithotripsy  . Obesity   . Ureteral stone 10/01/2013    Past Surgical History:  Procedure Laterality Date  .  stents  2006   cardiac stents  . CARDIAC CATHETERIZATION  01/13/2005   LEFT HEART  . carpel tunnel Bilateral    [name]  . LEFT HEART CATHETERIZATION  05/12/2007  . LEFT HEART CATHETERIZATION WITH CORONARY ANGIOGRAM N/A 06/27/2014   Procedure: LEFT HEART CATHETERIZATION WITH CORONARY ANGIOGRAM;  Surgeon: [name], MD;   Location: [facility] CATH LAB;  Service: Cardiovascular;  Laterality: N/A;    Current Medications: Outpatient Medications Prior to Visit  Medication Sig Dispense Refill  . aspirin EC 81 MG tablet Take 81 mg by mouth daily.    . cetirizine (ZYRTEC) 10 MG tablet Take 10 mg by mouth daily as needed for allergies.    . Cinnamon 500 MG TABS Take 1,000 mg by mouth 2 (two) times daily.     . clopidogrel (PLAVIX) 75 MG tablet TAKE ONE TABLET BY MOUTH ONCE DAILY WITH BREAKFAST. 90 tablet 3  . CRESTOR 20 MG tablet Take 20 mg by mouth every evening.     [name] losartan (COZAAR) 25 MG tablet TAKE 1 TABLET BY MOUTH DAILY 90 tablet 3  . metFORMIN (GLUCOPHAGE) 1000 MG tablet Take 1,000 mg by mouth 2 (two) times daily with a meal.    . metoprolol succinate (TOPROL-XL) 100 MG 24 hr tablet TAKE 1 TABLET BY MOUTH ONCE DAILY. 90 tablet 2  . MULTAQ 400 MG tablet TAKE (1) TABLET BY MOUTH TWICE DAILY WITH A MEAL. 60 tablet 9  . niacin (NIASPAN) 1000 MG CR tablet Take 1,000 mg by mouth at bedtime.     . nitroGLYCERIN (NITROSTAT) 0.4 MG SL tablet  PLACE ONE TBALET UNDER THE TONGUE EVERY 5 MINUTES AS NEEDED FOR CHEST PAIN. 25 tablet 0  . Omega-3 Fatty Acids (FISH OIL) 1200 MG CAPS Take 1 capsule by mouth 2 (two) times daily.    . hydrocortisone (ANUSOL-HC) 2.5 % rectal cream Place 1 application rectally 2 (two) times daily. Use up to twice a day for up to 10 days at a time 30 g 1   No facility-administered medications prior to visit.     Allergies:   Patient has no known allergies.   Social History   Socioeconomic History  . Marital status: Married    Spouse name: Not on file  . Number of children: Not on file  . Years of education: Not on file  . Highest education level: Not on file  Occupational History  . Not on file  Tobacco Use  . Smoking status: Former Smoker    Years: 8.00    Types: Cigarettes    Quit date: 09/22/2004    Years since quitting: 16.1  . Smokeless tobacco: Never Used  Vaping Use  . Vaping Use:  Never used  Substance and Sexual Activity  . Alcohol use: No    Alcohol/week: 0.0 standard drinks  . Drug use: No  . Sexual activity: Yes  Other Topics Concern  . Not on file  Social History Narrative   Lives with wife in [place].  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   Social Determinants of Health   Financial Resource Strain: Not on file  Food Insecurity: Not on file  Transportation Needs: Not on file  Physical Activity: Not on file  Stress: Not on file  Social Connections: Not on file     Family History:  The patient's family history includes Hypertension in his mother.   Review of Systems:   Please see the history of present illness.     General:  No chills, fever, night sweats or weight changes.  Cardiovascular:  No chest pain, edema, orthopnea, palpitations, paroxysmal nocturnal dyspnea. Positive for dyspnea on exertion.  Dermatological: No rash, lesions/masses Respiratory: No cough, dyspnea Urologic: No hematuria, dysuria Abdominal:   No nausea, vomiting, diarrhea, bright red blood per rectum, melena, or hematemesis Neurologic:  No visual changes, wkns, changes in mental status. All other systems reviewed and are otherwise negative except as noted above.   Physical Exam:    Affect appropriate Healthy:  appears stated age 69: normal Neck supple with no adenopathy JVP normal no bruits no thyromegaly Lungs clear with no wheezing and good diaphragmatic motion Heart:  S1/S2 no murmur, no rub, gallop or click PMI normal Abdomen: benighn, BS positve, no tenderness, no AAA no bruit.  No HSM or HJR Distal pulses intact with no bruits No edema Neuro non-focal Skin warm and dry No muscular weakness   Wt Readings from Last 3 Encounters:  11/03/20 113.6 kg  12/21/19 114 kg  12/14/19 113.4 kg     Studies/Labs Reviewed:   EKG:  10/11/19  NSR, HR 70 with no acute ST changes when compared to prior tracings.   Recent  Labs: No results found for requested labs within last 8760 hours.   Lipid Panel    Component Value Date/Time   CHOL 90 01/24/2013 0732   TRIG 129 01/24/2013 0732   HDL 30 (L) 01/24/2013 0732   CHOLHDL 3.0 01/24/2013 0732   VLDL 26 01/24/2013 0732   LDLCALC 34 01/24/2013 0732    Additional studies/ records  that were reviewed today include:   Myovue:  10/16/19 Normal  No ischemia EF 53%   Cardiac Catheterization: 06/2014 Coronary angiography: Coronary dominance: right  Left mainstem:  Normal  Left anterior descending (LAD):  Normal                          D1: Normal             D2 Small and normal  Left circumflex (LCx):  Codominant  Normal              OM1: normal             OM2: normal             OM3 normal  Right coronary artery (RCA):  30% tubular proximal  30% diffuse instent restenosis in mid vessel                PDA:  Small and normal             PLA:  Small and normal  Left ventriculography: Left ventricular systolic function is normal, LVEF is estimated at 55-65%, there is no significant mitral regurgitation    Recommendations:  No stenotic lesions Stent patent  No new left sided lesions  Continue medical Rx  Assessment:    CAD  Plan:   In order of problems listed above:  1. CAD/Dyspnea on Exertion - he is s/p DES to RCA in 2006 with patent stent by repeat cath in 2015. - . Normal myovue 10/16/19 no ischemia EF 53% - continue ASA, Plavix, BB and statin therapy.   2. History of Atrial Flutter - maintaining NSR Continue current regimen with Toprol-XL 100mg  daily and Multaq 400mg  BID. No longer on anticoagulation given no recent occurrence.   3. HTN - Well controlled.  Continue current medications and low sodium Dash type diet.     4. HLD - followed by PCP. Will request most recent labs. Goal LDL is less than 70 with known CAD. Continue Crestor 20mg  daily.    F/U in a year   Signed, Jenkins Rouge, MD  11/03/2020 9:18 AM    [place] [place], [place] [address] Phone: [phone] Fax: [phone]

## 2020-11-03 ENCOUNTER — Ambulatory Visit (INDEPENDENT_AMBULATORY_CARE_PROVIDER_SITE_OTHER): Payer: Self-pay | Admitting: Cardiovascular Disease

## 2020-11-03 ENCOUNTER — Other Ambulatory Visit: Payer: Self-pay

## 2020-11-03 ENCOUNTER — Encounter: Payer: Self-pay | Admitting: Cardiovascular Disease

## 2020-11-03 VITALS — BP 138/76 | HR 72 | Ht 72.0 in | Wt 250.4 lb

## 2020-11-03 DIAGNOSIS — I2510 Atherosclerotic heart disease of native coronary artery without angina pectoris: Secondary | ICD-10-CM

## 2020-11-03 DIAGNOSIS — I480 Paroxysmal atrial fibrillation: Secondary | ICD-10-CM

## 2020-11-03 NOTE — Patient Instructions (Signed)
Medication Instructions:  Your physician recommends that you continue on your current medications as directed. Please refer to the Current Medication list given to you today.  *If you need a refill on your cardiac medications before your next appointment, please call your pharmacy*   Lab Work: NONE   If you have labs (blood work) drawn today and your tests are completely normal, you will receive your results only by: . MyChart Message (if you have MyChart) OR . A paper copy in the mail If you have any lab test that is abnormal or we need to change your treatment, we will call you to review the results.   Testing/Procedures: NONE    Follow-Up: At [facility], you and your health needs are our priority.  As part of our continuing mission to provide you with exceptional heart care, we have created designated Provider Care Teams.  These Care Teams include your primary Cardiologist (physician) and Advanced Practice Providers (APPs -  Physician Assistants and Nurse Practitioners) who all work together to provide you with the care you need, when you need it.  We recommend signing up for the patient portal called "MyChart".  Sign up information is provided on this After Visit Summary.  MyChart is used to connect with patients for Virtual Visits (Telemedicine).  Patients are able to view lab/test results, encounter notes, upcoming appointments, etc.  Non-urgent messages can be sent to your provider as well.   To learn more about what you can do with MyChart, go to [email or web address].    Your next appointment:   1 year(s)  The format for your next appointment:   In Person  Provider:   Peter Nishan, MD   Other Instructions Thank you for choosing [facility]!    

## 2021-01-02 ENCOUNTER — Telehealth: Payer: Self-pay | Admitting: *Deleted

## 2021-01-02 NOTE — Telephone Encounter (Signed)
Faxed patient assistance application back to [facility] office Attn: Alda Berthold

## 2021-01-02 NOTE — Telephone Encounter (Signed)
Pt assistance forms faxed to [facility] patient assistance.

## 2021-01-09 ENCOUNTER — Telehealth: Payer: Self-pay | Admitting: Cardiovascular Disease

## 2021-01-09 NOTE — Telephone Encounter (Signed)
New message    Patient wife called and they got a denial letter for Multaq - they said that the diagnosis was not defined enough please call

## 2021-01-13 NOTE — Telephone Encounter (Signed)
Pt notified that new diagnosis code was sent for approval.

## 2021-02-18 ENCOUNTER — Encounter: Payer: Self-pay | Admitting: Internal Medicine

## 2021-03-19 ENCOUNTER — Telehealth: Payer: Self-pay | Admitting: *Deleted

## 2021-03-19 NOTE — Telephone Encounter (Signed)
Pt notified that his patient assistance Multaq has arrived in office.

## 2021-05-05 ENCOUNTER — Encounter: Payer: Self-pay | Admitting: Gastroenterology

## 2021-05-05 ENCOUNTER — Encounter: Payer: Self-pay | Admitting: *Deleted

## 2021-05-05 ENCOUNTER — Other Ambulatory Visit: Payer: Self-pay

## 2021-05-05 ENCOUNTER — Ambulatory Visit (INDEPENDENT_AMBULATORY_CARE_PROVIDER_SITE_OTHER): Payer: Self-pay | Admitting: Gastroenterology

## 2021-05-05 VITALS — BP 180/89 | HR 65 | Temp 97.7°F | Ht 72.0 in | Wt 250.6 lb

## 2021-05-05 DIAGNOSIS — K642 Third degree hemorrhoids: Secondary | ICD-10-CM

## 2021-05-05 DIAGNOSIS — K625 Hemorrhage of anus and rectum: Secondary | ICD-10-CM

## 2021-05-05 NOTE — H&P (View-Only) (Signed)
Referring Provider: Celene Squibb, MD Primary Care Physician:  Celene Squibb, MD Primary GI: Dr. Abbey Chatters   Chief Complaint  Patient presents with   Hemorrhoids    Occ bleeding when wipes. Has been putting off TCS d/t covid    HPI:   [name] is a 60 y.o. male presenting today with a history of chronic low-volume rectal bleeding. He was initially seen in 2020 for colonoscopy but had to postpone due to Covid-19. He is on Plavix. No prior colonoscopy. No family history of colorectal cancer or polyps.  He has no abdominal pain, N/V. No dysphagia. No unexplained weight loss or lack of appetite. Prolapsing hemorrhoids per description. No rectal pain. Denies changes in bowel habits. Intermittent bleeding with wiping.  He has to pay out of pocket, then send itemized statements to [employer].   Past Medical History:  Diagnosis Date   Atrial flutter ([place])    a. Dx 12/2010 - briefly on coumadin in 2012, CHADS2 = 2/CHA2DS2VASc = 3;  b. No recurrence, on Multaq;  c. 08/2012 Echo: EF 55-60%.   CAD (coronary artery disease)    a. 01/2005 NSTEMI/PCI: LM nl, LAD nl, RI 20 ost, LCX nl, RCA 30p/88m(3.5x23 Cypher DES), EF 60%   CHF (congestive heart failure) ([place]    Diabetes mellitus, type II ([place]    HTN (hypertension)    Hyperlipidemia    Nephrolithiasis    a. s/p lithotripsy   Obesity    Ureteral stone 10/01/2013    Past Surgical History:  Procedure Laterality Date    stents  2006   cardiac stents   CARDIAC CATHETERIZATION  01/13/2005   LEFT HEART   carpel tunnel Bilateral    [facility]   LEFT HEART CATHETERIZATION  05/12/2007   LEFT HEART CATHETERIZATION WITH CORONARY ANGIOGRAM N/A 06/27/2014   Procedure: LEFT HEART CATHETERIZATION WITH CORONARY ANGIOGRAM;  Surgeon: PJosue Hector MD;  [facility];  Service: Cardiovascular;  Laterality: N/A;    Current Outpatient Medications  Medication Sig Dispense Refill   aspirin EC 81 MG tablet Take 81 mg by mouth  daily.     cetirizine (ZYRTEC) 10 MG tablet Take 10 mg by mouth daily as needed for allergies.     Cinnamon 500 MG TABS Take 1,000 mg by mouth 2 (two) times daily.      clopidogrel (PLAVIX) 75 MG tablet TAKE ONE TABLET BY MOUTH ONCE DAILY WITH BREAKFAST. 90 tablet 3   CRESTOR 20 MG tablet Take 20 mg by mouth every evening.      losartan (COZAAR) 25 MG tablet TAKE 1 TABLET BY MOUTH DAILY 90 tablet 3   metFORMIN (GLUCOPHAGE) 1000 MG tablet Take 1,000 mg by mouth 2 (two) times daily with a meal.     metoprolol succinate (TOPROL-XL) 100 MG 24 hr tablet TAKE 1 TABLET BY MOUTH ONCE DAILY. 90 tablet 2   MULTAQ 400 MG tablet TAKE (1) TABLET BY MOUTH TWICE DAILY WITH A MEAL. 60 tablet 9   niacin (NIASPAN) 1000 MG CR tablet Take 1,000 mg by mouth at bedtime.      nitroGLYCERIN (NITROSTAT) 0.4 MG SL tablet PLACE ONE TBALET UNDER THE TONGUE EVERY 5 MINUTES AS NEEDED FOR CHEST PAIN. 25 tablet 0   Omega-3 Fatty Acids (FISH OIL) 1000 MG CAPS Take 1 capsule by mouth 2 (two) times daily.     Omega-3 Fatty Acids (FISH OIL) 1200 MG CAPS Take 1 capsule by mouth 2 (two) times  daily. (Patient not taking: Reported on 05/05/2021)     No current facility-administered medications for this visit.    Allergies as of 05/05/2021   (No Known Allergies)    Family History  Problem Relation Age of Onset   Hypertension Mother    Colon cancer Neg Hx    Colon polyps Neg Hx     Social History   Socioeconomic History   Marital status: Married    Spouse name: Not on file   Number of children: Not on file   Years of education: Not on file   Highest education level: Not on file  Occupational History   Not on file  Tobacco Use   Smoking status: Former    Years: 8.00    Types: Cigarettes    Quit date: 09/22/2004    Years since quitting: 16.6   Smokeless tobacco: Never  Vaping Use   Vaping Use: Never used  Substance and Sexual Activity   Alcohol use: No    Alcohol/week: 0.0 standard drinks   Drug use: No    Sexual activity: Yes  Other Topics Concern   Not on file  Social History Narrative   Lives with wife in [place].  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   Social Determinants of Health   Financial Resource Strain: Not on file  Food Insecurity: Not on file  Transportation Needs: Not on file  Physical Activity: Not on file  Stress: Not on file  Social Connections: Not on file    Review of Systems: Gen: Denies fever, chills, anorexia. Denies fatigue, weakness, weight loss.  CV: Denies chest pain, palpitations, syncope, peripheral edema, and claudication. Resp: Denies dyspnea at rest, cough, wheezing, coughing up blood, and pleurisy. GI: see HPI Derm: Denies rash, itching, dry skin Psych: Denies depression, anxiety, memory loss, confusion. No homicidal or suicidal ideation.  Heme: see HPI  Physical Exam: BP (!) 180/89   Pulse 65   Temp 97.7 F (36.5 C) (Temporal)   Ht 6' (1.829 m)   Wt 250 lb 9.6 oz (113.7 kg)   BMI 33.99 kg/m  General:   Alert and oriented. No distress noted. Pleasant and cooperative.  Head:  Normocephalic and atraumatic. Eyes:  Conjuctiva clear without scleral icterus. Mouth:  mask in place Cardiac: S1 S2 present without murmurs Lungs: clear bilaterally Abdomen:  +BS, soft, non-tender and non-distended. No rebound or guarding. No HSM or masses noted. Msk:  Symmetrical without gross deformities. Normal posture. Extremities:  Without edema. Neurologic:  Alert and  oriented x4 Psych:  Alert and cooperative. Normal mood and affect.  ASSESSMENT: [name] is a 60 y.o. male presenting today with history of chronic low-volume hematochezia, Grade 3 hemorrhoids by description, with no prior colonoscopy. He has no family history of colorectal cancer or polyps. On Plavix with history of CAD and stents in remote past. Recent cardiac follow-up in Feb 2022 and doing well.   Will pursue colonoscopy in the near future. He is  desirous of banding, which we discussed entails a higher risk of bleeding on Plavix and would not want to stop Plavix for a prolonged period of time for this. He is eager to pursue this in the future. As he is self pay, we will attempt to provide out of pocket costs for him.   Likely dealing with benign anorectal source of bleeding. Wife present today. Pursuing colonoscopy in near future.    PLAN:  Proceed with colonoscopy by  Dr. Abbey Chatters  in near future: the risks, benefits, and alternatives have been discussed with the patient in detail. The patient states understanding and desires to proceed.  Hold metformin day of Hold Plavix 5 days prior Return in follow-up to discuss banding   Annitta Needs, PhD, [facility] Gastroenterology

## 2021-05-05 NOTE — Progress Notes (Signed)
Referring Provider: Celene Squibb, MD Primary Care Physician:  Celene Squibb, MD Primary GI: Dr. Abbey Chatters   Chief Complaint  Patient presents with   Hemorrhoids    Occ bleeding when wipes. Has been putting off TCS d/t covid    HPI:   [name] is a 60 y.o. male presenting today with a history of chronic low-volume rectal bleeding. He was initially seen in 2020 for colonoscopy but had to postpone due to Covid-19. He is on Plavix. No prior colonoscopy. No family history of colorectal cancer or polyps.  He has no abdominal pain, N/V. No dysphagia. No unexplained weight loss or lack of appetite. Prolapsing hemorrhoids per description. No rectal pain. Denies changes in bowel habits. Intermittent bleeding with wiping.  He has to pay out of pocket, then send itemized statements to [employer].   Past Medical History:  Diagnosis Date   Atrial flutter ([place])    a. Dx 12/2010 - briefly on coumadin in 2012, CHADS2 = 2/CHA2DS2VASc = 3;  b. No recurrence, on Multaq;  c. 08/2012 Echo: EF 55-60%.   CAD (coronary artery disease)    a. 01/2005 NSTEMI/PCI: LM nl, LAD nl, RI 20 ost, LCX nl, RCA 30p/37m(3.5x23 Cypher DES), EF 60%   CHF (congestive heart failure) ([place]    Diabetes mellitus, type II ([place]    HTN (hypertension)    Hyperlipidemia    Nephrolithiasis    a. s/p lithotripsy   Obesity    Ureteral stone 10/01/2013    Past Surgical History:  Procedure Laterality Date    stents  2006   cardiac stents   CARDIAC CATHETERIZATION  01/13/2005   LEFT HEART   carpel tunnel Bilateral    [facility]   LEFT HEART CATHETERIZATION  05/12/2007   LEFT HEART CATHETERIZATION WITH CORONARY ANGIOGRAM N/A 06/27/2014   Procedure: LEFT HEART CATHETERIZATION WITH CORONARY ANGIOGRAM;  Surgeon: PJosue Hector MD;  [facility];  Service: Cardiovascular;  Laterality: N/A;    Current Outpatient Medications  Medication Sig Dispense Refill   aspirin EC 81 MG tablet Take 81 mg by mouth  daily.     cetirizine (ZYRTEC) 10 MG tablet Take 10 mg by mouth daily as needed for allergies.     Cinnamon 500 MG TABS Take 1,000 mg by mouth 2 (two) times daily.      clopidogrel (PLAVIX) 75 MG tablet TAKE ONE TABLET BY MOUTH ONCE DAILY WITH BREAKFAST. 90 tablet 3   CRESTOR 20 MG tablet Take 20 mg by mouth every evening.      losartan (COZAAR) 25 MG tablet TAKE 1 TABLET BY MOUTH DAILY 90 tablet 3   metFORMIN (GLUCOPHAGE) 1000 MG tablet Take 1,000 mg by mouth 2 (two) times daily with a meal.     metoprolol succinate (TOPROL-XL) 100 MG 24 hr tablet TAKE 1 TABLET BY MOUTH ONCE DAILY. 90 tablet 2   MULTAQ 400 MG tablet TAKE (1) TABLET BY MOUTH TWICE DAILY WITH A MEAL. 60 tablet 9   niacin (NIASPAN) 1000 MG CR tablet Take 1,000 mg by mouth at bedtime.      nitroGLYCERIN (NITROSTAT) 0.4 MG SL tablet PLACE ONE TBALET UNDER THE TONGUE EVERY 5 MINUTES AS NEEDED FOR CHEST PAIN. 25 tablet 0   Omega-3 Fatty Acids (FISH OIL) 1000 MG CAPS Take 1 capsule by mouth 2 (two) times daily.     Omega-3 Fatty Acids (FISH OIL) 1200 MG CAPS Take 1 capsule by mouth 2 (two) times  daily. (Patient not taking: Reported on 05/05/2021)     No current facility-administered medications for this visit.    Allergies as of 05/05/2021   (No Known Allergies)    Family History  Problem Relation Age of Onset   Hypertension Mother    Colon cancer Neg Hx    Colon polyps Neg Hx     Social History   Socioeconomic History   Marital status: Married    Spouse name: Not on file   Number of children: Not on file   Years of education: Not on file   Highest education level: Not on file  Occupational History   Not on file  Tobacco Use   Smoking status: Former    Years: 8.00    Types: Cigarettes    Quit date: 09/22/2004    Years since quitting: 16.6   Smokeless tobacco: Never  Vaping Use   Vaping Use: Never used  Substance and Sexual Activity   Alcohol use: No    Alcohol/week: 0.0 standard drinks   Drug use: No    Sexual activity: Yes  Other Topics Concern   Not on file  Social History Narrative   Lives with wife in [place].  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   Social Determinants of Health   Financial Resource Strain: Not on file  Food Insecurity: Not on file  Transportation Needs: Not on file  Physical Activity: Not on file  Stress: Not on file  Social Connections: Not on file    Review of Systems: Gen: Denies fever, chills, anorexia. Denies fatigue, weakness, weight loss.  CV: Denies chest pain, palpitations, syncope, peripheral edema, and claudication. Resp: Denies dyspnea at rest, cough, wheezing, coughing up blood, and pleurisy. GI: see HPI Derm: Denies rash, itching, dry skin Psych: Denies depression, anxiety, memory loss, confusion. No homicidal or suicidal ideation.  Heme: see HPI  Physical Exam: BP (!) 180/89   Pulse 65   Temp 97.7 F (36.5 C) (Temporal)   Ht 6' (1.829 m)   Wt 250 lb 9.6 oz (113.7 kg)   BMI 33.99 kg/m  General:   Alert and oriented. No distress noted. Pleasant and cooperative.  Head:  Normocephalic and atraumatic. Eyes:  Conjuctiva clear without scleral icterus. Mouth:  mask in place Cardiac: S1 S2 present without murmurs Lungs: clear bilaterally Abdomen:  +BS, soft, non-tender and non-distended. No rebound or guarding. No HSM or masses noted. Msk:  Symmetrical without gross deformities. Normal posture. Extremities:  Without edema. Neurologic:  Alert and  oriented x4 Psych:  Alert and cooperative. Normal mood and affect.  ASSESSMENT: [name] is a 60 y.o. male presenting today with history of chronic low-volume hematochezia, Grade 3 hemorrhoids by description, with no prior colonoscopy. He has no family history of colorectal cancer or polyps. On Plavix with history of CAD and stents in remote past. Recent cardiac follow-up in Feb 2022 and doing well.   Will pursue colonoscopy in the near future. He is  desirous of banding, which we discussed entails a higher risk of bleeding on Plavix and would not want to stop Plavix for a prolonged period of time for this. He is eager to pursue this in the future. As he is self pay, we will attempt to provide out of pocket costs for him.   Likely dealing with benign anorectal source of bleeding. Wife present today. Pursuing colonoscopy in near future.    PLAN:  Proceed with colonoscopy by  Dr. Abbey Chatters  in near future: the risks, benefits, and alternatives have been discussed with the patient in detail. The patient states understanding and desires to proceed.  Hold metformin day of Hold Plavix 5 days prior Return in follow-up to discuss banding   Annitta Needs, PhD, [facility] Gastroenterology

## 2021-05-05 NOTE — Patient Instructions (Signed)
We are arranging a colonoscopy in the near future. Please do not take metformin the morning of the procedure. Please stop Plavix 5 days prior.  I am discussing with Dr. Abbey Chatters about the hemorrhoid banding, and we will look into out of pocket expenses for banding.  Further recommendations to follow!  I enjoyed seeing you again today! As you know, I value our relationship and want to provide genuine, compassionate, and quality care. I welcome your feedback. If you receive a survey regarding your visit,  I greatly appreciate you taking time to fill this out. See you next time!  Annitta Needs, PhD, ANP-BC [facility] Gastroenterology

## 2021-05-06 ENCOUNTER — Other Ambulatory Visit: Payer: Self-pay | Admitting: Cardiovascular Disease

## 2021-05-26 NOTE — Patient Instructions (Signed)
[facility] [facility]  05/26/2021     '@PREFPERIOPPHARMACY'$ @   Your procedure is scheduled on  06/01/2021.   Report to [facility] at  0700 A.M.  Call this number if you have problems the morning of surgery:  [phone]   Remember: Follow the diet and prep instructions given to you by the office.                 Take these medicines the morning of surgery with A SIP OF WATER                         metoprolol, multaq.     Do not wear jewelry, make-up or nail polish.  Do not wear lotions, powders, or perfumes, or deodorant.  Do not shave 48 hours prior to surgery.  Men may shave face and neck.  Do not bring valuables to the hospital.  [facility] is not responsible for any belongings or valuables.  Contacts, dentures or bridgework may not be worn into surgery.  Leave your suitcase in the car.  After surgery it may be brought to your room.  For patients admitted to the hospital, discharge time will be determined by your treatment team.  Patients discharged the day of surgery will not be allowed to drive home and must have someone with them for 24 hours.    Special instructions:   DO NOT smoke tobacco or vape for 24 hours before your procedure.  Please read over the following fact sheets that you were given. Anesthesia Post-op Instructions and Care and Recovery After Surgery      Colonoscopy, Adult, Care After This sheet gives you information about how to care for yourself after your procedure. Your health care provider may also give you more specific instructions. If you have problems or questions, contact your health care provider. What can I expect after the procedure? After the procedure, it is common to have: A small amount of blood in your stool for 24 hours after the procedure. Some gas. Mild cramping or bloating of your abdomen. Follow these instructions at home: Eating and drinking  Drink enough fluid to keep your urine pale yellow. Follow instructions from  your health care provider about eating or drinking restrictions. Resume your normal diet as instructed by your health care provider. Avoid heavy or fried foods that are hard to digest. Activity Rest as told by your health care provider. Avoid sitting for a long time without moving. Get up to take short walks every 1-2 hours. This is important to improve blood flow and breathing. Ask for help if you feel weak or unsteady. Return to your normal activities as told by your health care provider. Ask your health care provider what activities are safe for you. Managing cramping and bloating  Try walking around when you have cramps or feel bloated. Apply heat to your abdomen as told by your health care provider. Use the heat source that your health care provider recommends, such as a moist heat pack or a heating pad. Place a towel between your skin and the heat source. Leave the heat on for 20-30 minutes. Remove the heat if your skin turns bright red. This is especially important if you are unable to feel pain, heat, or cold. You may have a greater risk of getting burned. General instructions If you were given a sedative during the procedure, it can affect you for several hours. Do not  drive or operate machinery until your health care provider says that it is safe. For the first 24 hours after the procedure: Do not sign important documents. Do not drink alcohol. Do your regular daily activities at a slower pace than normal. Eat soft foods that are easy to digest. Take over-the-counter and prescription medicines only as told by your health care provider. Keep all follow-up visits as told by your health care provider. This is important. Contact a health care provider if: You have blood in your stool 2-3 days after the procedure. Get help right away if you have: More than a small spotting of blood in your stool. Large blood clots in your stool. Swelling of your abdomen. Nausea or vomiting. A  fever. Increasing pain in your abdomen that is not relieved with medicine. Summary After the procedure, it is common to have a small amount of blood in your stool. You may also have mild cramping and bloating of your abdomen. If you were given a sedative during the procedure, it can affect you for several hours. Do not drive or operate machinery until your health care provider says that it is safe. Get help right away if you have a lot of blood in your stool, nausea or vomiting, a fever, or increased pain in your abdomen. This information is not intended to replace advice given to you by your health care provider. Make sure you discuss any questions you have with your health care provider. Document Revised: 08/24/2019 Document Reviewed: 03/26/2019 Elsevier Patient Education  [place] After This sheet gives you information about how to care for yourself after your procedure. Your health care provider may also give you more specific instructions. If you have problems or questions, contact your health care provider. What can I expect after the procedure? After the procedure, it is common to have: Tiredness. Forgetfulness about what happened after the procedure. Impaired judgment for important decisions. Nausea or vomiting. Some difficulty with balance. Follow these instructions at home: For the time period you were told by your health care provider:   Rest as needed. Do not participate in activities where you could fall or become injured. Do not drive or use machinery. Do not drink alcohol. Do not take sleeping pills or medicines that cause drowsiness. Do not make important decisions or sign legal documents. Do not take care of children on your own. Eating and drinking Follow the diet that is recommended by your health care provider. Drink enough fluid to keep your urine pale yellow. If you vomit: Drink water, juice, or soup when you can drink  without vomiting. Make sure you have little or no nausea before eating solid foods. General instructions Have a responsible adult stay with you for the time you are told. It is important to have someone help care for you until you are awake and alert. Take over-the-counter and prescription medicines only as told by your health care provider. If you have sleep apnea, surgery and certain medicines can increase your risk for breathing problems. Follow instructions from your health care provider about wearing your sleep device: Anytime you are sleeping, including during daytime naps. While taking prescription pain medicines, sleeping medicines, or medicines that make you drowsy. Avoid smoking. Keep all follow-up visits as told by your health care provider. This is important. Contact a health care provider if: You keep feeling nauseous or you keep vomiting. You feel light-headed. You are still sleepy or having trouble with balance after 24  hours. You develop a rash. You have a fever. You have redness or swelling around the IV site. Get help right away if: You have trouble breathing. You have new-onset confusion at home. Summary For several hours after your procedure, you may feel tired. You may also be forgetful and have poor judgment. Have a responsible adult stay with you for the time you are told. It is important to have someone help care for you until you are awake and alert. Rest as told. Do not drive or operate machinery. Do not drink alcohol or take sleeping pills. Get help right away if you have trouble breathing, or if you suddenly become confused. This information is not intended to replace advice given to you by your health care provider. Make sure you discuss any questions you have with your health care provider. Document Revised: 05/15/2020 Document Reviewed: 08/02/2019 Elsevier Patient Education  2022 [employer].

## 2021-05-28 ENCOUNTER — Encounter (HOSPITAL_COMMUNITY)
Admission: RE | Admit: 2021-05-28 | Discharge: 2021-05-28 | Disposition: A | Discharge status: Home / Self Care | Payer: Self-pay | Source: Ambulatory Visit | Attending: Internal Medicine | Admitting: Internal Medicine

## 2021-05-28 ENCOUNTER — Other Ambulatory Visit: Payer: Self-pay

## 2021-05-28 ENCOUNTER — Encounter (HOSPITAL_COMMUNITY): Payer: Self-pay

## 2021-05-28 DIAGNOSIS — Z01818 Encounter for other preprocedural examination: Secondary | ICD-10-CM | POA: Insufficient documentation

## 2021-05-28 LAB — BASIC METABOLIC PANEL
Anion gap: 7 (ref 5–15)
BUN: 15 mg/dL (ref 6–20)
CO2: 25 mmol/L (ref 22–32)
Calcium: 8.9 mg/dL (ref 8.9–10.3)
Chloride: 105 mmol/L (ref 98–111)
Creatinine, Ser: 1.06 mg/dL (ref 0.61–1.24)
GFR, Estimated: >60 mL/min (ref >60)
Glucose, Bld: 122 mg/dL — ABNORMAL HIGH (ref 70–99)
Potassium: 4.1 mmol/L (ref 3.5–5.1)
Sodium: 137 mmol/L (ref 135–145)

## 2021-06-01 ENCOUNTER — Encounter (HOSPITAL_COMMUNITY)
Admission: RE | Disposition: A | Discharge status: Home / Self Care | Payer: Self-pay | Source: Home / Self Care | Attending: Internal Medicine

## 2021-06-01 ENCOUNTER — Ambulatory Visit (HOSPITAL_COMMUNITY): Payer: Self-pay | Admitting: Anesthesiology

## 2021-06-01 ENCOUNTER — Ambulatory Visit (HOSPITAL_COMMUNITY)
Admission: RE | Admit: 2021-06-01 | Discharge: 2021-06-01 | Disposition: A | Discharge status: Home / Self Care | Payer: Self-pay | Attending: Internal Medicine | Admitting: Internal Medicine

## 2021-06-01 ENCOUNTER — Encounter (HOSPITAL_COMMUNITY): Payer: Self-pay

## 2021-06-01 ENCOUNTER — Other Ambulatory Visit: Payer: Self-pay

## 2021-06-01 DIAGNOSIS — Z7984 Long term (current) use of oral hypoglycemic drugs: Secondary | ICD-10-CM | POA: Insufficient documentation

## 2021-06-01 DIAGNOSIS — D123 Benign neoplasm of transverse colon: Secondary | ICD-10-CM | POA: Insufficient documentation

## 2021-06-01 DIAGNOSIS — Z7982 Long term (current) use of aspirin: Secondary | ICD-10-CM | POA: Insufficient documentation

## 2021-06-01 DIAGNOSIS — Z79899 Other long term (current) drug therapy: Secondary | ICD-10-CM | POA: Insufficient documentation

## 2021-06-01 DIAGNOSIS — K648 Other hemorrhoids: Secondary | ICD-10-CM | POA: Insufficient documentation

## 2021-06-01 DIAGNOSIS — Z955 Presence of coronary angioplasty implant and graft: Secondary | ICD-10-CM | POA: Insufficient documentation

## 2021-06-01 DIAGNOSIS — D125 Benign neoplasm of sigmoid colon: Secondary | ICD-10-CM | POA: Insufficient documentation

## 2021-06-01 DIAGNOSIS — K625 Hemorrhage of anus and rectum: Secondary | ICD-10-CM | POA: Insufficient documentation

## 2021-06-01 DIAGNOSIS — Z7902 Long term (current) use of antithrombotics/antiplatelets: Secondary | ICD-10-CM | POA: Insufficient documentation

## 2021-06-01 DIAGNOSIS — Z87891 Personal history of nicotine dependence: Secondary | ICD-10-CM | POA: Insufficient documentation

## 2021-06-01 HISTORY — PX: POLYPECTOMY: SHX5525

## 2021-06-01 HISTORY — PX: BIOPSY: SHX5522

## 2021-06-01 HISTORY — PX: COLONOSCOPY WITH PROPOFOL: SHX5780

## 2021-06-01 LAB — GLUCOSE, CAPILLARY: Glucose-Capillary: 121 mg/dL — ABNORMAL HIGH (ref 70–99)

## 2021-06-01 SURGERY — COLONOSCOPY WITH PROPOFOL
Anesthesia: General

## 2021-06-01 MED ORDER — STERILE WATER FOR IRRIGATION IR SOLN
Status: DC | PRN
  Administered 2021-06-01: 200 mL

## 2021-06-01 MED ORDER — PROPOFOL 500 MG/50ML IV EMUL
INTRAVENOUS | Status: DC | PRN
  Administered 2021-06-01: 100 mg via INTRAVENOUS
  Administered 2021-06-01: 125 ug/kg/min via INTRAVENOUS

## 2021-06-01 MED ORDER — LACTATED RINGERS IV SOLN
INTRAVENOUS | Status: DC
  Administered 2021-06-01: 1000 mL via INTRAVENOUS

## 2021-06-01 NOTE — Op Note (Signed)
[facility] Patient Name: [name] Procedure Date: 06/01/2021 8:06 AM MRN: [id number] Date of Birth: April 01, 1961 Attending MD: Elon Alas. Abbey Chatters DO CSN: [id number] Age: 60 Admit Type: Ambulatory Procedure:                Colonoscopy Indications:              Rectal bleeding Providers:                Elon Alas. Abbey Chatters, DO, Caprice Kluver, Clenton Pare, Technician Referring MD:              Medicines:                See the Anesthesia note for documentation of the                            administered medications Complications:            No immediate complications. Estimated Blood Loss:     Estimated blood loss was minimal. Procedure:                Pre-Anesthesia Assessment:                           - The anesthesia plan was to use monitored                            anesthesia care (MAC).                           After obtaining informed consent, the colonoscope                            was passed under direct vision. Throughout the                            procedure, the patient's blood pressure, pulse, and                            oxygen saturations were monitored continuously. The                            PCF-HQ190L ([id number]) scope was introduced through                            the anus and advanced to the the cecum, identified                            by appendiceal orifice and ileocecal valve. The                            colonoscopy was performed without difficulty. The                            patient tolerated the procedure well. The quality  of the bowel preparation was evaluated using the                            BBPS [facility] Bowel Preparation Scale) with scores                            of: Right Colon = 2 (minor amount of residual                            staining, small fragments of stool and/or opaque                            liquid, but mucosa seen well),  Transverse Colon = 2                            (minor amount of residual staining, small fragments                            of stool and/or opaque liquid, but mucosa seen                            well) and Left Colon = 2 (minor amount of residual                            staining, small fragments of stool and/or opaque                            liquid, but mucosa seen well). The total BBPS score                            equals 6. The quality of the bowel preparation was                            fair. Scope In: 8:20:04 AM Scope Out: 8:41:22 AM Scope Withdrawal Time: 0 hours 18 minutes 0 seconds  Total Procedure Duration: 0 hours 21 minutes 18 seconds  Findings:      The perianal and digital rectal examinations were normal.      Non-bleeding internal hemorrhoids were found during retroflexion.      A 2 mm polyp was found in the transverse colon. The polyp was sessile.       The polyp was removed with a cold biopsy forceps. Resection and       retrieval were complete.      A 8 mm polyp was found in the transverse colon. The polyp was sessile.       The polyp was removed with a cold snare. Resection and retrieval were       complete.      Three semi-pedunculated polyps were found in the sigmoid colon. The       polyps were 5 to 8 mm in size. These polyps were removed with a cold       snare. Resection and retrieval were complete. Impression:               - Preparation of the colon  was fair.                           - Non-bleeding internal hemorrhoids.                           - One 2 mm polyp in the transverse colon, removed                            with a cold biopsy forceps. Resected and retrieved.                           - One 8 mm polyp in the transverse colon, removed                            with a cold snare. Resected and retrieved.                           - Three 5 to 8 mm polyps in the sigmoid colon,                            removed with a cold snare.  Resected and retrieved. Moderate Sedation:      Per Anesthesia Care Recommendation:           - Patient has a contact number available for                            emergencies. The signs and symptoms of potential                            delayed complications were discussed with the                            patient. Return to normal activities tomorrow.                            Written discharge instructions were provided to the                            patient.                           - Resume previous diet.                           - Continue present medications.                           - Await pathology results.                           - Repeat colonoscopy in 3 years for surveillance.                           - Return to GI clinic in 4 months. If rectal  bleeding continues, consider hemorrhoid banding Procedure Code(s):        --- Professional ---                           [phone], Colonoscopy, flexible; with removal of                            tumor(s), polyp(s), or other lesion(s) by snare                            technique                           45380, 27, Colonoscopy, flexible; with biopsy,                            single or multiple Diagnosis Code(s):        --- Professional ---                           K64.8, Other hemorrhoids                           K63.5, Polyp of colon                           K62.5, Hemorrhage of anus and rectum CPT copyright 2019 American Medical Association. All rights reserved. The codes documented in this report are preliminary and upon coder review may  be revised to meet current compliance requirements. Elon Alas. Abbey Chatters, DO [place] Abbey Chatters, DO 06/01/2021 8:44:48 AM This report has been signed electronically. Number of Addenda: 0

## 2021-06-01 NOTE — Transfer of Care (Signed)
Immediate Anesthesia Transfer of Care Note  Patient: [name]  Procedure(s) Performed: COLONOSCOPY WITH PROPOFOL POLYPECTOMY BIOPSY  Patient Location: Short Stay  Anesthesia Type:General  Level of Consciousness: awake, alert , oriented and patient cooperative  Airway & Oxygen Therapy: Patient Spontanous Breathing  Post-op Assessment: Report given to RN, Post -op Vital signs reviewed and stable and Patient moving all extremities X 4  Post vital signs: Reviewed and stable  Last Vitals:  Vitals Value Taken Time  BP    Temp    Pulse    Resp    SpO2      Last Pain:  Vitals:   06/01/21 0816  TempSrc:   PainSc: 0-No pain      Patients Stated Pain Goal: 8 (28/00/34 9179)  Complications: No notable events documented.

## 2021-06-01 NOTE — Discharge Instructions (Addendum)
  Colonoscopy Discharge Instructions  Read the instructions outlined below and refer to this sheet in the next few weeks. These discharge instructions provide you with general information on caring for yourself after you leave the hospital. Your doctor may also give you specific instructions. While your treatment has been planned according to the most current medical practices available, unavoidable complications occasionally occur.   ACTIVITY You may resume your regular activity, but move at a slower pace for the next 24 hours.  Take frequent rest periods for the next 24 hours.  Walking will help get rid of the air and reduce the bloated feeling in your belly (abdomen).  No driving for 24 hours (because of the medicine (anesthesia) used during the test).   Do not sign any important legal documents or operate any machinery for 24 hours (because of the anesthesia used during the test).  NUTRITION Drink plenty of fluids.  You may resume your normal diet as instructed by your doctor.  Begin with a light meal and progress to your normal diet. Heavy or fried foods are harder to digest and may make you feel sick to your stomach (nauseated).  Avoid alcoholic beverages for 24 hours or as instructed.  MEDICATIONS You may resume your normal medications unless your doctor tells you otherwise.  WHAT YOU CAN EXPECT TODAY Some feelings of bloating in the abdomen.  Passage of more gas than usual.  Spotting of blood in your stool or on the toilet paper.  IF YOU HAD POLYPS REMOVED DURING THE COLONOSCOPY: No aspirin products for 7 days or as instructed.  No alcohol for 7 days or as instructed.  Eat a soft diet for the next 24 hours.  FINDING OUT THE RESULTS OF YOUR TEST Not all test results are available during your visit. If your test results are not back during the visit, make an appointment with your caregiver to find out the results. Do not assume everything is normal if you have not heard from your  caregiver or the medical facility. It is important for you to follow up on all of your test results.  SEEK IMMEDIATE MEDICAL ATTENTION IF: You have more than a spotting of blood in your stool.  Your belly is swollen (abdominal distention).  You are nauseated or vomiting.  You have a temperature over 101.  You have abdominal pain or discomfort that is severe or gets worse throughout the day.   Your colonoscopy revealed 5 polyp(s) which I removed successfully. Await pathology results, my office will contact you. I recommend repeating colonoscopy in 3 years for surveillance purposes.   You also have internal hemorrhoids which is likely the cause of your bleeding. I would recommend increasing fiber in your diet or adding OTC Benefiber/Metamucil. Be sure to drink at least 4 to 6 glasses of water daily. Follow up with Roseanne Kaufman to discuss hemorrhoid banding.   Hold your PLAVIX for 2 more days.     I hope you have a great rest of your week!  Elon Alas. Abbey Chatters, D.O. Gastroenterology and Hepatology [facility]

## 2021-06-01 NOTE — Anesthesia Preprocedure Evaluation (Signed)
Anesthesia Evaluation  Patient identified by MRN, date of birth, ID band Patient awake    Reviewed: Allergy & Precautions, H&P , NPO status , Patient's Chart, lab work & pertinent test results, reviewed documented beta blocker date and time   Airway Mallampati: II  TM Distance: >3 FB Neck ROM: full    Dental no notable dental hx.    Pulmonary neg pulmonary ROS, former smoker,    Pulmonary exam normal breath sounds clear to auscultation       Cardiovascular Exercise Tolerance: Good hypertension, + CAD   Rhythm:regular Rate:Normal     Neuro/Psych negative neurological ROS  negative psych ROS   GI/Hepatic negative GI ROS, Neg liver ROS,   Endo/Other  negative endocrine ROSdiabetes  Renal/GU negative Renal ROS  negative genitourinary   Musculoskeletal   Abdominal   Peds  Hematology negative hematology ROS (+)   Anesthesia Other Findings   Reproductive/Obstetrics negative OB ROS                             Anesthesia Physical Anesthesia Plan  ASA: 3  Anesthesia Plan: General   Post-op Pain Management:    Induction:   PONV Risk Score and Plan: Propofol infusion  Airway Management Planned:   Additional Equipment:   Intra-op Plan:   Post-operative Plan:   Informed Consent: I have reviewed the patients History and Physical, chart, labs and discussed the procedure including the risks, benefits and alternatives for the proposed anesthesia with the patient or authorized representative who has indicated his/her understanding and acceptance.     Dental Advisory Given  Plan Discussed with: CRNA  Anesthesia Plan Comments:         Anesthesia Quick Evaluation

## 2021-06-01 NOTE — Interval H&P Note (Signed)
History and Physical Interval Note:  06/01/2021 7:50 AM  [name]  has presented today for surgery, with the diagnosis of rectal bleeding.  The various methods of treatment have been discussed with the patient and family. After consideration of risks, benefits and other options for treatment, the patient has consented to  Procedure(s) with comments: COLONOSCOPY WITH PROPOFOL (N/A) - 8:30am as a surgical intervention.  The patient's history has been reviewed, patient examined, no change in status, stable for surgery.  I have reviewed the patient's chart and labs.  Questions were answered to the patient's satisfaction.     [name]

## 2021-06-01 NOTE — Anesthesia Postprocedure Evaluation (Signed)
Anesthesia Post Note  Patient: [name]  Procedure(s) Performed: COLONOSCOPY WITH PROPOFOL POLYPECTOMY BIOPSY  Patient location during evaluation: Phase II Anesthesia Type: General Level of consciousness: awake Pain management: pain level controlled Vital Signs Assessment: post-procedure vital signs reviewed and stable Respiratory status: spontaneous breathing and respiratory function stable Cardiovascular status: blood pressure returned to baseline and stable Postop Assessment: no headache and no apparent nausea or vomiting Anesthetic complications: no Comments: Late entry   No notable events documented.   Last Vitals:  Vitals:   06/01/21 0719 06/01/21 0844  BP: (!) 145/93 107/72  Pulse: 70 62  Resp: 15 17  Temp: 36.7 C 36.6 C  SpO2: 97% 94%    Last Pain:  Vitals:   06/01/21 0844  TempSrc: Oral  PainSc:                  Louann Sjogren

## 2021-06-02 LAB — SURGICAL PATHOLOGY

## 2021-06-04 ENCOUNTER — Telehealth: Payer: Self-pay | Admitting: *Deleted

## 2021-06-04 NOTE — Telephone Encounter (Signed)
Call to notify pt that patient assistance Multaq has arrived in office. Pt states that he will pick up on today or tomorrow.

## 2021-06-05 ENCOUNTER — Encounter (HOSPITAL_COMMUNITY): Payer: Self-pay | Admitting: Internal Medicine

## 2021-06-17 ENCOUNTER — Encounter: Payer: Self-pay | Admitting: Internal Medicine

## 2021-06-22 ENCOUNTER — Other Ambulatory Visit: Payer: Self-pay | Admitting: Cardiovascular Disease

## 2021-06-29 ENCOUNTER — Telehealth: Payer: Self-pay

## 2021-06-29 NOTE — Telephone Encounter (Signed)
Prior Auth for Repatha not initiated because this was never prescribed

## 2021-06-30 ENCOUNTER — Other Ambulatory Visit: Payer: Self-pay | Admitting: Cardiovascular Disease

## 2021-09-10 ENCOUNTER — Telehealth: Payer: Self-pay | Admitting: Cardiovascular Disease

## 2021-09-10 NOTE — Telephone Encounter (Signed)
Wife of patient called. The patient gets his MULTAQ 400 MG tablet  shipped to the office for him to pick up. She was not sure when the next shipment would arrive. Please advise

## 2021-09-10 NOTE — Telephone Encounter (Signed)
Spoke with wife Katharine Look. Informed that reorder form was faxed today and that we would notify pt when medication arrives in office. Also informed wife that patient needs to reapply for patient assistance in the new year. Will place application at front desk for pick up.

## 2021-10-09 ENCOUNTER — Other Ambulatory Visit: Payer: Self-pay | Admitting: Cardiovascular Disease

## 2021-10-21 NOTE — Progress Notes (Signed)
Cardiology Office Note    Date:  10/22/2021   ID:  [name], DOB [birth year], MRN [id number]  PCP:  Celene Squibb, MD  Cardiologist: Jenkins Rouge, MD    No chief complaint on file.   History of Present Illness:    [name] is a 61 y.o. 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 Dx 12/2010 - briefly on coumadin in 2012, CHADS2 = 2/CHA2DS2VASc = 3;  b. No recurrence, on Multaq;  c. 08/2012 Echo: EF 55-60%.   CAD (coronary artery disease)    a. 01/2005 NSTEMI/PCI: LM nl, LAD nl, RI 20 ost, LCX nl, RCA 30p/7m (3.5x23 Cypher DES), EF 60%   CHF (congestive heart failure) ([place])    Diabetes mellitus, type II (HCC)    HTN (hypertension)    Hyperlipidemia    Nephrolithiasis    a. s/p lithotripsy   Obesity    Ureteral stone 10/01/2013    Past Surgical History:  Procedure Laterality Date    stents  2006   cardiac stents   BIOPSY  06/01/2021   Procedure: BIOPSY;  Surgeon: [name], DO;  Location: [facility];  Service: Endoscopy;;   CARDIAC CATHETERIZATION  01/13/2005   LEFT HEART   carpel tunnel Bilateral    [facility] [place]   COLONOSCOPY WITH PROPOFOL N/A 06/01/2021   Procedure: COLONOSCOPY WITH PROPOFOL;  Surgeon: [name], DO;  Location: [facility]  ENDO SUITE;  Service: Endoscopy;  Laterality: N/A;  8:30am   LEFT HEART CATHETERIZATION  05/12/2007   LEFT HEART CATHETERIZATION WITH CORONARY ANGIOGRAM N/A 06/27/2014   Procedure: LEFT HEART CATHETERIZATION WITH CORONARY ANGIOGRAM;  Surgeon: [name], MD;  Location: [facility] CATH LAB;  Service: Cardiovascular;  Laterality: N/A;   POLYPECTOMY  06/01/2021   Procedure: POLYPECTOMY;  Surgeon: [name], DO;  Location: [facility];  Service: Endoscopy;;    Current Medications: Outpatient Medications Prior to Visit  Medication Sig Dispense Refill   acetaminophen (TYLENOL) 500 MG tablet Take 1,000 mg by mouth every 8 (eight) hours as needed for moderate pain.     aspirin EC 81 MG tablet Take 81 mg by mouth daily.     cetirizine (ZYRTEC) 10 MG tablet Take 10 mg by mouth daily as needed for allergies.     Cinnamon 500 MG TABS Take 1,000 mg by mouth 2 (two) times daily.      clopidogrel (PLAVIX) 75 MG tablet TAKE ONE TABLET BY MOUTH ONCE DAILY WITH BREAKFAST. 90 tablet 3   CRESTOR 20 MG tablet Take 20 mg by mouth every evening.      diphenhydrAMINE (BENADRYL) 25 MG tablet Take 25  mg by mouth at bedtime.     Empagliflozin-Linaglip-Metform (TRIJARDY XR) 12.5-2.01-999 MG TB24      ibuprofen (ADVIL) 200 MG tablet Take 400 mg by mouth every 6 (six) hours as needed for moderate pain.     losartan (COZAAR) 25 MG tablet TAKE 1 TABLET BY MOUTH DAILY 90 tablet 1   metFORMIN (GLUCOPHAGE) 1000 MG tablet Take 1,000 mg by mouth daily with breakfast.     metoprolol succinate (TOPROL-XL) 100 MG 24 hr tablet TAKE 1 TABLET BY MOUTH ONCE DAILY. 90 tablet 0   MULTAQ 400 MG tablet TAKE (1) TABLET BY MOUTH TWICE DAILY WITH A MEAL. 60 tablet 9   niacin (NIASPAN) 1000 MG CR tablet Take 1,000 mg by mouth at bedtime.      nitroGLYCERIN (NITROSTAT) 0.4 MG SL tablet PLACE ONE TBALET UNDER THE TONGUE EVERY 5 MINUTES AS NEEDED FOR CHEST PAIN. 25 tablet 0   Omega-3 Fatty Acids (FISH OIL) 1200 MG CAPS Take 1,200 mg by  mouth 2 (two) times daily.     No facility-administered medications prior to visit.     Allergies:   Patient has no known allergies.   Social History   Socioeconomic History   Marital status: Married    Spouse name: Not on file   Number of children: Not on file   Years of education: Not on file   Highest education level: Not on file  Occupational History   Not on file  Tobacco Use   Smoking status: Former    Years: 8.00    Types: Cigarettes    Quit date: 09/22/2004    Years since quitting: 17.0   Smokeless tobacco: Never  Vaping Use   Vaping Use: Never used  Substance and Sexual Activity   Alcohol use: No    Alcohol/week: 0.0 standard drinks   Drug use: No   Sexual activity: Yes  Other Topics Concern   Not on file  Social History Narrative   Lives with wife in [place].  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   Social Determinants of Health   Financial Resource Strain: Not on file  Food Insecurity: Not on file  Transportation Needs: Not on file  Physical Activity: Not on file  Stress: Not on file  Social Connections: Not on file     Family History:  The patient's family history includes Hypertension in his mother.   Review of Systems:   Please see the history of present illness.     General:  No chills, fever, night sweats or weight changes.  Cardiovascular:  No chest pain, edema, orthopnea, palpitations, paroxysmal nocturnal dyspnea. Positive for dyspnea on exertion.  Dermatological: No rash, lesions/masses Respiratory: No cough, dyspnea Urologic: No hematuria, dysuria Abdominal:   No nausea, vomiting, diarrhea, bright red blood per rectum, melena, or hematemesis Neurologic:  No visual changes, wkns, changes in mental status. All other systems reviewed and are otherwise negative except as noted above.   Physical Exam:    Affect appropriate Healthy:  appears stated age 61: normal Neck supple with no  adenopathy JVP normal no bruits no thyromegaly Lungs clear with no wheezing and good diaphragmatic motion Heart:  S1/S2 no murmur, no rub, gallop or click PMI normal Abdomen: benighn, BS positve, no tenderness, no AAA no bruit.  No HSM or HJR Distal pulses intact with no bruits Trace edema some varicosities inner part of left calf  Neuro non-focal Skin warm and dry No muscular weakness  Wt Readings from Last 3 Encounters:  10/22/21 247 lb 12.8 oz (112.4 kg)  06/01/21 240 lb (108.9 kg)  05/28/21 240 lb (108.9 kg)     Studies/Labs Reviewed:   EKG:  10/11/19  NSR, HR 70 with no acute ST changes when compared to prior tracings.   Recent Labs: 05/28/2021: BUN 15; Creatinine, Ser 1.06; Potassium 4.1; Sodium 137   Lipid Panel    Component Value Date/Time   CHOL 90 01/24/2013 0732   TRIG 129 01/24/2013 0732   HDL 30 (L) 01/24/2013 0732   CHOLHDL 3.0 01/24/2013 0732   VLDL 26 01/24/2013 0732   LDLCALC 34 01/24/2013 0732    Additional studies/ records that were reviewed today include:   Myovue:  10/16/19 Normal  No ischemia EF 53%   Cardiac Catheterization: 06/2014 Coronary angiography: Coronary dominance: right   Left mainstem:  Normal   Left anterior descending (LAD):  Normal                          D1: Normal             D2 Small and normal   Left circumflex (LCx):  Codominant  Normal               OM1: normal             OM2: normal             OM3 normal   Right coronary artery (RCA):  30% tubular proximal  30% diffuse instent restenosis in mid vessel                 PDA:  Small and normal             PLA:  Small and normal   Left ventriculography: Left ventricular systolic function is normal, LVEF is estimated at 55-65%, there is no significant mitral regurgitation      Recommendations:  No stenotic lesions Stent patent  No new left sided lesions  Continue medical Rx  Assessment:    CAD  Plan:   In order of problems listed above:  1.  CAD/Dyspnea on Exertion - he is s/p DES to RCA in 2006 with patent stent by repeat cath in 2015. - . Normal myovue 10/16/19 no ischemia EF 53% - continue ASA, Plavix, BB and statin therapy.   2. History of Atrial Flutter - maintaining NSR Continue current regimen with Toprol-XL 100mg  daily and Multaq 400mg  BID. No longer on anticoagulation given no recent occurrence.   3. HTN - Well controlled.  Continue current medications and low sodium Dash type diet.     4. HLD - followed by PCP. Will request most recent labs. Goal LDL is less than 70 with known CAD. Continue Crestor 20mg  daily.   5. Varicosity:  discussed elevation legs and compression stocking for left calf as needed    F/U in a year   Signed, Jenkins Rouge, MD  10/22/2021 3:07 PM    [facility]. [address] [place], [place] [address] Phone: [phone] Fax: [phone]

## 2021-10-22 ENCOUNTER — Encounter: Payer: Self-pay | Admitting: Cardiovascular Disease

## 2021-10-22 ENCOUNTER — Other Ambulatory Visit: Payer: Self-pay

## 2021-10-22 ENCOUNTER — Ambulatory Visit (INDEPENDENT_AMBULATORY_CARE_PROVIDER_SITE_OTHER): Payer: Self-pay | Admitting: Cardiovascular Disease

## 2021-10-22 VITALS — BP 132/80 | HR 68 | Ht 72.0 in | Wt 247.8 lb

## 2021-10-22 DIAGNOSIS — I10 Essential (primary) hypertension: Secondary | ICD-10-CM

## 2021-10-22 DIAGNOSIS — I2510 Atherosclerotic heart disease of native coronary artery without angina pectoris: Secondary | ICD-10-CM

## 2021-10-22 DIAGNOSIS — I480 Paroxysmal atrial fibrillation: Secondary | ICD-10-CM

## 2021-10-22 NOTE — Patient Instructions (Addendum)
Follow-Up: Follow up in 1 year with Dr. Johnsie Cancel  Any Other Special Instructions Will Be Listed Below (If Applicable).     If you need a refill on your cardiac medications before your next appointment, please call your pharmacy.

## 2021-10-29 ENCOUNTER — Telehealth: Payer: Self-pay | Admitting: *Deleted

## 2021-10-29 NOTE — Telephone Encounter (Signed)
Agreed, this is on label use. Would need to see which diagnosis code was filled out on the patient assistance application, it may have been written wrong. Updated ICD10 codes and diagnosis should be called in or sent to [facility] for them to re-review.

## 2021-10-29 NOTE — Telephone Encounter (Signed)
Received fax from [employer] stating that they denied his pt assistance for Multaq because Pt has an off-label diagnosis. Please advise.

## 2021-10-29 NOTE — Telephone Encounter (Signed)
Physician portion of application faxed back to [employer] with new dx code of [place].1.

## 2021-11-04 NOTE — Telephone Encounter (Signed)
Pt notified that he was approved for the [employer] Patient Connection Program [facility]). Patient approved through 11/02/22.

## 2021-11-10 ENCOUNTER — Other Ambulatory Visit: Payer: Self-pay

## 2021-11-10 ENCOUNTER — Encounter: Payer: Self-pay | Admitting: Gastroenterology

## 2021-11-10 ENCOUNTER — Ambulatory Visit (INDEPENDENT_AMBULATORY_CARE_PROVIDER_SITE_OTHER): Payer: Self-pay | Admitting: Gastroenterology

## 2021-11-10 VITALS — BP 132/78 | HR 78 | Temp 98.0°F | Ht 72.0 in | Wt 245.8 lb

## 2021-11-10 DIAGNOSIS — K642 Third degree hemorrhoids: Secondary | ICD-10-CM

## 2021-11-10 MED ORDER — HYDROCORTISONE (PERIANAL) 2.5 % EX CREA: 1.0000 "application " | TOPICAL_CREAM | Freq: Two times a day (BID) | CUTANEOUS | 3 refills | Status: AC

## 2021-11-10 NOTE — Progress Notes (Signed)
Referring Provider: Celene Squibb, MD Primary Care Physician:  Celene Squibb, MD Primary GI: Dr. Abbey Chatters   Chief Complaint  Patient presents with   Hemorrhoids    HPI:   [name] is a 61 y.o. male presenting today with a history of internal hemorrhoids, s/p colonoscopy recently with adenomas. Surveillance due in 3 years.   He is on Plavix. Here to discuss possible banding. He has no significant bleeding. He does have prolapsing tissue that is bothersome. Some itching and burning at times. Has prolonged toilet time. Not on fiber supplementation.   Past Medical History:  Diagnosis Date   Atrial flutter ([place])    a. Dx 12/2010 - briefly on coumadin in 2012, CHADS2 = 2/CHA2DS2VASc = 3;  b. No recurrence, on Multaq;  c. 08/2012 Echo: EF 55-60%.   CAD (coronary artery disease)    a. 01/2005 NSTEMI/PCI: LM nl, LAD nl, RI 20 ost, LCX nl, RCA 30p/74m (3.5x23 Cypher DES), EF 60%   CHF (congestive heart failure) (HCC)    Diabetes mellitus, type II (HCC)    HTN (hypertension)    Hyperlipidemia    Nephrolithiasis    a. s/p lithotripsy   Obesity    Ureteral stone 10/01/2013    Past Surgical History:  Procedure Laterality Date    stents  09/13/2004   cardiac stents   BIOPSY  06/01/2021   Procedure: BIOPSY;  Surgeon: [name], DO;  [facility];  Service: Endoscopy;;   CARDIAC CATHETERIZATION  01/13/2005   LEFT HEART   carpel tunnel Bilateral    Murphy/ Wainer   COLONOSCOPY WITH PROPOFOL N/A 06/01/2021   Non-bleeding internal hemorrhoids, one 2 mm polyp at transverse colon s/p removal, 8 mm polyp in transverse colon, three 5-8 mm polyps in sigmoid colon. Tubular adenomas.  3 year surveillance.   LEFT HEART CATHETERIZATION  05/12/2007   LEFT HEART CATHETERIZATION WITH CORONARY ANGIOGRAM N/A 06/27/2014   Procedure: LEFT HEART CATHETERIZATION WITH CORONARY ANGIOGRAM;  Surgeon: [name], MD;  Location: [facility] CATH LAB;  Service: Cardiovascular;  Laterality: N/A;    POLYPECTOMY  06/01/2021   Procedure: POLYPECTOMY;  Surgeon: [name], DO;  [facility];  Service: Endoscopy;;    Current Outpatient Medications  Medication Sig Dispense Refill   acetaminophen (TYLENOL) 500 MG tablet Take 1,000 mg by mouth every 8 (eight) hours as needed for moderate pain.     aspirin EC 81 MG tablet Take 81 mg by mouth daily.     cetirizine (ZYRTEC) 10 MG tablet Take 10 mg by mouth daily as needed for allergies.     Cinnamon 500 MG TABS Take 1,000 mg by mouth 2 (two) times daily.      clopidogrel (PLAVIX) 75 MG tablet TAKE ONE TABLET BY MOUTH ONCE DAILY WITH BREAKFAST. 90 tablet 3   CRESTOR 20 MG tablet Take 20 mg by mouth every evening.      diphenhydrAMINE (BENADRYL) 25 MG tablet Take 25 mg by mouth at bedtime.     Empagliflozin-Linaglip-Metform (TRIJARDY XR) 12.5-2.01-999 MG TB24      hydrocortisone (PROCTOZONE-HC) 2.5 % rectal cream Place 1 application rectally 2 (two) times daily. 30 g 3   ibuprofen (ADVIL) 200 MG tablet Take 400 mg by mouth every 6 (six) hours as needed for moderate pain.     losartan (COZAAR) 25 MG tablet TAKE 1 TABLET BY MOUTH DAILY 90 tablet 1   metFORMIN (GLUCOPHAGE) 1000 MG tablet Take  1,000 mg by mouth daily with breakfast.     metoprolol succinate (TOPROL-XL) 100 MG 24 hr tablet TAKE 1 TABLET BY MOUTH ONCE DAILY. 90 tablet 0   MULTAQ 400 MG tablet TAKE (1) TABLET BY MOUTH TWICE DAILY WITH A MEAL. 60 tablet 9   niacin (NIASPAN) 1000 MG CR tablet Take 1,000 mg by mouth at bedtime.      nitroGLYCERIN (NITROSTAT) 0.4 MG SL tablet PLACE ONE TBALET UNDER THE TONGUE EVERY 5 MINUTES AS NEEDED FOR CHEST PAIN. 25 tablet 0   Omega-3 Fatty Acids (FISH OIL) 1200 MG CAPS Take 1,200 mg by mouth 2 (two) times daily.     No current facility-administered medications for this visit.    Allergies as of 11/10/2021   (No Known Allergies)    Family History  Problem Relation Age of Onset   Hypertension Mother    Colon cancer Neg Hx     Colon polyps Neg Hx     Social History   Socioeconomic History   Marital status: Married    Spouse name: Not on file   Number of children: Not on file   Years of education: Not on file   Highest education level: Not on file  Occupational History   Not on file  Tobacco Use   Smoking status: Former    Years: 8.00    Types: Cigarettes    Quit date: 09/22/2004    Years since quitting: 17.1   Smokeless tobacco: Never  Vaping Use   Vaping Use: Never used  Substance and Sexual Activity   Alcohol use: No    Alcohol/week: 0.0 standard drinks   Drug use: No   Sexual activity: Yes  Other Topics Concern   Not on file  Social History Narrative   Lives with wife in [place].  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   Social Determinants of Health   Financial Resource Strain: Not on file  Food Insecurity: Not on file  Transportation Needs: Not on file  Physical Activity: Not on file  Stress: Not on file  Social Connections: Not on file    Review of Systems: Gen: Denies fever, chills, anorexia. Denies fatigue, weakness, weight loss.  CV: Denies chest pain, palpitations, syncope, peripheral edema, and claudication. Resp: Denies dyspnea at rest, cough, wheezing, coughing up blood, and pleurisy. GI: see HPI Derm: Denies rash, itching, dry skin Psych: Denies depression, anxiety, memory loss, confusion. No homicidal or suicidal ideation.  Heme: Denies bruising, bleeding, and enlarged lymph nodes.  Physical Exam: BP 132/78    Pulse 78    Temp 98 F (36.7 C) (Temporal)    Ht 6' (1.829 m)    Wt 245 lb 12.8 oz (111.5 kg)    BMI 33.34 kg/m  General:   Alert and oriented. No distress noted. Pleasant and cooperative.  Head:  Normocephalic and atraumatic. Eyes:  Conjuctiva clear without scleral icterus. Rectal: no external hemorrhoids, no prolapsing tissue, no fissure. DRE without discomfort. No prolapsing tissue after DRE.  Msk:  Symmetrical without  gross deformities. Normal posture. Extremities:  Without edema. Neurologic:  Alert and  oriented x4 Psych:  Alert and cooperative. Normal mood and affect.  ASSESSMENT/PLAN: RAMA MCCLINTOCK is a 61 y.o. male presenting today with Grade 3 non-bleeding internal hemorrhoids, desirous of further therapies.   We discussed at length that banding while on Plavix carries a very high risk of bleeding; although banding him is not impossible, it is not  wise at this time and benefits do not outweigh the risk, as he is not bleeding significantly. We discussed supportive measures and behavior modification, specifically avoidance of prolonged toilet time. I have also given a prescription for Anusol cream to use BID as needed. He is to add Benefiber daily as well.   If he were to have bleeding or significantly worsening symptoms, we could consider banding. For now, we will follow with more supportive measures.  Annitta Needs, PhD, ANP-BC [facility]

## 2021-11-10 NOTE — Patient Instructions (Signed)
I recommend taking Benefiber 2 teaspoons daily.   You can use the hydrocortisone cream after each bowel movement.  It's important to limit toilet time to only 2-3 minutes to decrease the risk of hemorrhoids protruding.  Let me know if you start having bleeding or worsening of symptoms. Please call in about 6 weeks with an update!  I enjoyed seeing you again today! As you know, I value our relationship and want to provide genuine, compassionate, and quality care. I welcome your feedback. If you receive a survey regarding your visit,  I greatly appreciate you taking time to fill this out. See you next time!  Annitta Needs, PhD, ANP-BC [facility]

## 2021-11-19 ENCOUNTER — Encounter: Payer: Self-pay | Admitting: Gastroenterology

## 2021-12-11 ENCOUNTER — Telehealth: Payer: Self-pay | Admitting: Cardiovascular Disease

## 2021-12-11 NOTE — Telephone Encounter (Signed)
Established pt at our [place] office. ?Will route to [facility] triage pool for further follow-up with the pt and wife. ?

## 2021-12-11 NOTE — Telephone Encounter (Signed)
Pt's wife notified that Multaq 400 mg tablets were in office and ready for pick. Wife verbalized understanding.  ?

## 2021-12-11 NOTE — Telephone Encounter (Signed)
Wife calling to see if she can come pick up . Multaq '400mg'$ . She states she is approve for it. Please advise ?

## 2022-01-04 ENCOUNTER — Other Ambulatory Visit: Payer: Self-pay | Admitting: Cardiovascular Disease

## 2022-01-13 ENCOUNTER — Other Ambulatory Visit: Payer: Self-pay | Admitting: Cardiovascular Disease

## 2022-02-23 ENCOUNTER — Other Ambulatory Visit: Payer: Self-pay | Admitting: Cardiovascular Disease

## 2022-02-23 NOTE — Telephone Encounter (Signed)
This is a [place] pt.  °

## 2022-03-05 ENCOUNTER — Telehealth: Payer: Self-pay | Admitting: Cardiovascular Disease

## 2022-03-05 NOTE — Telephone Encounter (Signed)
Pt notified Multaq was ready to be picked up from office.

## 2022-07-19 ENCOUNTER — Other Ambulatory Visit: Payer: Self-pay | Admitting: Cardiovascular Disease

## 2022-09-10 ENCOUNTER — Telehealth: Payer: Self-pay | Admitting: Cardiovascular Disease

## 2022-09-10 NOTE — Telephone Encounter (Signed)
Pt's wife aware and will pick up application.

## 2022-09-10 NOTE — Telephone Encounter (Signed)
[employer] patient Media planner for Multaq at the front desk of [place] office awaiting patient pick up.

## 2022-09-10 NOTE — Telephone Encounter (Signed)
Pt c/o medication issue:  1. Name of Medication: MULTAQ 400 MG tablet  2. How are you currently taking this medication (dosage and times per day)?   3. Are you having a reaction (difficulty breathing--STAT)?   4. What is your medication issue?   Patient's wife states they received a notice that patient assistance for Multaq is expiring and the patient will need to reapply. She would like assistance with beginning this process.

## 2022-10-12 ENCOUNTER — Telehealth: Payer: Self-pay | Admitting: *Deleted

## 2022-10-12 NOTE — Telephone Encounter (Signed)
-----  Message from [name], MD sent at 10/11/2022 12:25 PM EST ----- Regarding: RE: Multaq Will have to stop it for now  ----- Message ----- From: Levonne Hubert, LPN Sent: 11/02/2540  11:02 AM EST To: [name], MD; Cv Div Pharmd Subject: Multaq                                         Received a note from [facility]) stating that this patient is not eligible to receive patient assistance for Multaq. [employer] states that patient has an off-label diagnosis. The diagnosis used was I 48.92 ( atrial flutter). Spoke with [employer] who states that starting some of the ICD 10 codes accepted on last year are not being accepted this year.

## 2022-10-12 NOTE — Telephone Encounter (Signed)
Pt notified and states that he will have his wife contact Dr. Johnsie Cancel.

## 2022-10-18 ENCOUNTER — Telehealth: Payer: Self-pay | Admitting: Cardiovascular Disease

## 2022-10-18 NOTE — Telephone Encounter (Signed)
Wife is calling in to see, why the patient was denied for the application to get the meds. Please advise

## 2022-10-18 NOTE — Telephone Encounter (Signed)
Spoke to patients wife who is asking that paperwork be resubmitted with a different diagnosis code. Will fwd to [facility] for review.

## 2022-10-18 NOTE — Telephone Encounter (Signed)
-----   Message from [name], MD sent at 10/11/2022 12:25 PM EST ----- Regarding: RE: Multaq Will have to stop it for now  ----- Message ----- From: Levonne Hubert, LPN Sent: 7/84/1282  11:02 AM EST To: [name], MD; Cv Div Pharmd Subject: Multaq                                          Received a note from [facility]) stating that this patient is not eligible to receive patient assistance for Multaq. [employer] states that patient has an off-label diagnosis. The diagnosis used was I 48.92 ( atrial flutter). Spoke with [employer] who states that starting some of the ICD 10 codes accepted on last year are not being accepted this year.

## 2022-10-19 NOTE — Progress Notes (Signed)
Cardiology Office Note    Date:  10/29/2022   ID:  [name], DOB 1961-05-28, MRN [id number]  PCP:  Shaun Squibb, MD  Cardiologist: Shaun Rouge, MD    No chief complaint on file.   History of Present Illness:    [name] is a 62 y.o. 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 Dx 12/2010 - briefly on coumadin in 2012, CHADS2 = 2/CHA2DS2VASc = 3;  b. No recurrence, on Multaq;  c. 08/2012 Echo: EF 55-60%.   CAD (coronary artery disease)    a. 01/2005 NSTEMI/PCI: LM nl, LAD nl, RI 20 ost, LCX nl, RCA 30p/61m(3.5x23 Cypher DES), EF 60%   CHF (congestive heart failure) (HCC)    Diabetes mellitus, type II (HCC)    HTN (hypertension)    Hyperlipidemia    Nephrolithiasis    a. s/p lithotripsy   Obesity    Ureteral stone 10/01/2013    Past Surgical History:  Procedure Laterality Date    stents  09/13/2004   cardiac stents   BIOPSY  06/01/2021   Procedure: BIOPSY;  Surgeon: CEloise Harman DO;  [facility];  Service: Endoscopy;;   CARDIAC CATHETERIZATION  01/13/2005   LEFT HEART   carpel  tunnel Bilateral    Murphy/ Wainer   COLONOSCOPY WITH PROPOFOL N/A 06/01/2021   Non-bleeding internal hemorrhoids, one 2 mm polyp at transverse colon s/p removal, 8 mm polyp in transverse colon, three 5-8 mm polyps in sigmoid colon. Tubular adenomas.  3 year surveillance.   LEFT HEART CATHETERIZATION  05/12/2007   LEFT HEART CATHETERIZATION WITH CORONARY ANGIOGRAM N/A 06/27/2014   Procedure: LEFT HEART CATHETERIZATION WITH CORONARY ANGIOGRAM;  Surgeon: PJosue Hector MD;  [facility];  Service: Cardiovascular;  Laterality: N/A;   POLYPECTOMY  06/01/2021   Procedure: POLYPECTOMY;  Surgeon: CEloise Harman DO;  [facility];  Service: Endoscopy;;    Current Medications: Outpatient Medications Prior to Visit  Medication Sig Dispense Refill   acetaminophen (TYLENOL) 500 MG tablet Take 1,000 mg by mouth every 8 (eight) hours as needed for moderate pain.     aspirin EC 81 MG tablet Take 81 mg by mouth daily.     cetirizine (ZYRTEC) 10 MG tablet Take 10 mg by mouth daily as needed for allergies.     Cinnamon 500 MG TABS Take 1,000 mg by mouth 2 (two) times daily.      clopidogrel (PLAVIX) 75 MG tablet TAKE ONE TABLET  BY MOUTH ONCE DAILY WITH BREAKFAST. 90 tablet 3   CRESTOR 20 MG tablet Take 20 mg by mouth every evening.      diphenhydrAMINE (BENADRYL) 25 MG tablet Take 25 mg by mouth at bedtime.     hydrocortisone (PROCTOZONE-HC) 2.5 % rectal cream Place 1 application rectally 2 (two) times daily. 30 g 3   ibuprofen (ADVIL) 200 MG tablet Take 400 mg by mouth every 6 (six) hours as needed for moderate pain.     losartan (COZAAR) 25 MG tablet TAKE 1 TABLET BY MOUTH DAILY 90 tablet 3   metFORMIN (GLUCOPHAGE) 1000 MG tablet Take 1,000 mg by mouth daily with breakfast.     metoprolol succinate (TOPROL-XL) 100 MG 24 hr tablet TAKE 1 TABLET BY MOUTH ONCE DAILY. 90 tablet 2   MULTAQ 400 MG tablet TAKE (1) TABLET BY MOUTH TWICE DAILY WITH A MEAL. 60 tablet 9   niacin (NIASPAN)  1000 MG CR tablet Take 1,000 mg by mouth at bedtime.      nitroGLYCERIN (NITROSTAT) 0.4 MG SL tablet PLACE ONE TBALET UNDER THE TONGUE EVERY 5 MINUTES AS NEEDED FOR CHEST PAIN. 25 tablet 0   Omega-3 Fatty Acids (FISH OIL) 1200 MG CAPS Take 1,200 mg by mouth 2 (two) times daily.     Empagliflozin-Linaglip-Metform (TRIJARDY XR) 12.5-2.01-999 MG TB24      Empagliflozin-linaGLIPtin (GLYXAMBI) 25-5 MG TABS      No facility-administered medications prior to visit.     Allergies:   Patient has no known allergies.   Social History   Socioeconomic History   Marital status: Married    Spouse name: Not on file   Number of children: Not on file   Years of education: Not on file   Highest education level: Not on file  Occupational History   Not on file  Tobacco Use   Smoking status: Former    Years: 8.00    Types: Cigarettes    Quit date: 09/22/2004    Years since quitting: 18.1   Smokeless tobacco: Never  Vaping Use   Vaping Use: Never used  Substance and Sexual Activity   Alcohol use: No    Alcohol/week: 0.0 standard drinks of alcohol   Drug use: No   Sexual activity: Yes  Other Topics Concern   Not on file  Social History Narrative   Lives with wife in [place].  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   Social Determinants of Health   Financial Resource Strain: Not on file  Food Insecurity: Not on file  Transportation Needs: Not on file  Physical Activity: Not on file  Stress: Not on file  Social Connections: Not on file     Family History:  The patient's family history includes Hypertension in his mother.   Review of Systems:   Please see the history of present illness.     General:  No chills, fever, night sweats or weight changes.  Cardiovascular:  No chest pain, edema, orthopnea, palpitations, paroxysmal nocturnal dyspnea. Positive for dyspnea on exertion.  Dermatological: No rash, lesions/masses Respiratory: No cough,  dyspnea Urologic: No hematuria, dysuria Abdominal:   No nausea, vomiting, diarrhea, bright red blood per rectum, melena, or hematemesis Neurologic:  No visual changes, wkns, changes in mental status. All other systems reviewed and are otherwise negative except as noted above.   Physical Exam:    Affect appropriate Healthy:  appears stated age 52: normal Neck supple with no adenopathy JVP normal  no bruits no thyromegaly Lungs clear with no wheezing and good diaphragmatic motion Heart:  S1/S2 no murmur, no rub, gallop or click PMI normal Abdomen: benighn, BS positve, no tenderness, no AAA no bruit.  No HSM or HJR Distal pulses intact with no bruits Trace edema some varicosities inner part of left calf  Neuro non-focal Skin warm and dry No muscular weakness   Wt Readings from Last 3 Encounters:  10/29/22 237 lb 3.2 oz (107.6 kg)  11/10/21 245 lb 12.8 oz (111.5 kg)  10/22/21 247 lb 12.8 oz (112.4 kg)     Studies/Labs Reviewed:   EKG: 10/29/2022 SR old IMI no acute changes   Recent Labs: No results found for requested labs within last 365 days.   Lipid Panel    Component Value Date/Time   CHOL 90 01/24/2013 0732   TRIG 129 01/24/2013 0732   HDL 30 (L) 01/24/2013 0732   CHOLHDL 3.0 01/24/2013 0732   VLDL 26 01/24/2013 0732   LDLCALC 34 01/24/2013 0732    Additional studies/ records that were reviewed today include:   Myovue:  10/16/19 Normal  No ischemia EF 53%   Cardiac Catheterization: 06/2014 Coronary angiography: Coronary dominance: right   Left mainstem:  Normal   Left anterior descending (LAD):  Normal                          D1: Normal             D2 Small and normal   Left circumflex (LCx):  Codominant  Normal               OM1: normal             OM2: normal             OM3 normal   Right coronary artery (RCA):  30% tubular proximal  30% diffuse instent restenosis in mid vessel                 PDA:  Small and normal             PLA:   Small and normal   Left ventriculography: Left ventricular systolic function is normal, LVEF is estimated at 55-65%, there is no significant mitral regurgitation      Recommendations:  No stenotic lesions Stent patent  No new left sided lesions  Continue medical Rx  Assessment:    CAD  Plan:   In order of problems listed above:  1. CAD/Dyspnea on Exertion - he is s/p DES to RCA in 2006 with patent stent by repeat cath in 2015. - . Normal myovue 10/16/19 no ischemia EF 53% - continue ASA, Plavix, BB and statin therapy.   2. History of Atrial Flutter - maintaining NSR Continue current regimen with Toprol-XL 114m daily and Multaq 4073mBID. No longer on anticoagulation given no recent occurrence. He will have to stop Multaq if doesn't get patient assistance Re submitted with diagnosis of afib rather than flutter   3. HTN - Well controlled.  Continue current medications and low sodium Dash type diet.     4. HLD - followed by PCP. Will request most recent labs. Goal LDL is less than 70 with known CAD. Continue Crestor 2088maily.   5. Varicosity:  discussed elevation legs and compression stocking for left calf as needed   6. GI:  colon polyps and hemorrhoids It would be ok for him to hold plavix for  any banding procedure Repeat colonoscopy due in 2025    F/U in a year   Signed, Shaun Rouge, MD  10/29/2022 9:18 AM    [facility] [address] [place], [place] [address] Phone: [phone] Fax: [phone]

## 2022-10-21 NOTE — Telephone Encounter (Signed)
Returned call to wife. No answer. Voicemail not set up.

## 2022-10-25 NOTE — Telephone Encounter (Signed)
Spoke with Rushie Goltz who states that pt was seen by Afib clinic for afib in 2018

## 2022-10-29 ENCOUNTER — Encounter: Payer: Self-pay | Admitting: Cardiovascular Disease

## 2022-10-29 ENCOUNTER — Ambulatory Visit: Payer: Self-pay | Attending: Cardiovascular Disease | Admitting: Cardiovascular Disease

## 2022-10-29 VITALS — BP 138/78 | HR 70 | Ht 72.0 in | Wt 237.2 lb

## 2022-10-29 DIAGNOSIS — I4892 Unspecified atrial flutter: Secondary | ICD-10-CM

## 2022-10-29 DIAGNOSIS — I10 Essential (primary) hypertension: Secondary | ICD-10-CM

## 2022-10-29 DIAGNOSIS — I2510 Atherosclerotic heart disease of native coronary artery without angina pectoris: Secondary | ICD-10-CM

## 2022-10-29 DIAGNOSIS — I480 Paroxysmal atrial fibrillation: Secondary | ICD-10-CM

## 2022-10-29 NOTE — Patient Instructions (Signed)
Medication Instructions:  Your physician recommends that you continue on your current medications as directed. Please refer to the Current Medication list given to you today.  *If you need a refill on your cardiac medications before your next appointment, please call your pharmacy*   Lab Work: NONE   If you have labs (blood work) drawn today and your tests are completely normal, you will receive your results only by: [place] (if you have MyChart) OR A paper copy in the mail If you have any lab test that is abnormal or we need to change your treatment, we will call you to review the results.   Testing/Procedures: NONE    Follow-Up: At [facility], you and your health needs are our priority.  As part of our continuing mission to provide you with exceptional heart care, we have created designated Provider Care Teams.  These Care Teams include your primary Cardiologist (physician) and Advanced Practice Providers (APPs -  Physician Assistants and Nurse Practitioners) who all work together to provide you with the care you need, when you need it.  We recommend signing up for the patient portal called "MyChart".  Sign up information is provided on this After Visit Summary.  MyChart is used to connect with patients for Virtual Visits (Telemedicine).  Patients are able to view lab/test results, encounter notes, upcoming appointments, etc.  Non-urgent messages can be sent to your provider as well.   To learn more about what you can do with MyChart, go to [email or web address].    Your next appointment:   1 year(s)  Provider:   Jenkins Rouge, MD    Other Instructions Thank you for choosing [facility]!

## 2023-01-17 ENCOUNTER — Other Ambulatory Visit: Payer: Self-pay | Admitting: Cardiovascular Disease

## 2023-03-01 ENCOUNTER — Other Ambulatory Visit: Payer: Self-pay | Admitting: Cardiovascular Disease

## 2023-04-06 ENCOUNTER — Telehealth: Payer: Self-pay | Admitting: *Deleted

## 2023-04-06 NOTE — Telephone Encounter (Signed)
Called to notify pt that his Multaq has arrived in office.

## 2023-06-24 ENCOUNTER — Telehealth: Payer: Self-pay

## 2023-06-24 NOTE — Telephone Encounter (Signed)
Left message on pt and wife's phone that multaq arrived I office for pick up.

## 2023-07-22 ENCOUNTER — Other Ambulatory Visit: Payer: Self-pay | Admitting: Cardiovascular Disease

## 2023-07-22 ENCOUNTER — Encounter: Payer: Self-pay | Admitting: Internal Medicine

## 2023-09-06 ENCOUNTER — Other Ambulatory Visit: Payer: Self-pay

## 2023-09-06 MED ORDER — CLOPIDOGREL BISULFATE 75 MG PO TABS: 75.0000 mg | ORAL_TABLET | Freq: Every day | ORAL | 1 refills | Status: DC

## 2023-10-10 ENCOUNTER — Other Ambulatory Visit: Payer: Self-pay | Admitting: Cardiovascular Disease

## 2023-10-17 ENCOUNTER — Telehealth: Payer: Self-pay | Admitting: Cardiovascular Disease

## 2023-10-17 NOTE — Telephone Encounter (Signed)
Per K.Pinnix,LPN, patient needs to re-apply for assistance program he will come by office to obtain paperwork

## 2023-10-17 NOTE — Telephone Encounter (Signed)
Pt c/o medication issue:  1. Name of Medication:   MULTAQ 400 MG tablet   2. How are you currently taking this medication (dosage and times per day)?   As prescribed  3. Are you having a reaction (difficulty breathing--STAT)?   No  4. What is your medication issue?   Wife Dois Davenport) called to check if patient has this medication ready for him to pick up.

## 2023-12-06 ENCOUNTER — Telehealth: Payer: Self-pay

## 2023-12-06 NOTE — Telephone Encounter (Signed)
 Wife notified that multaq had arrived in office.

## 2024-02-01 ENCOUNTER — Other Ambulatory Visit: Payer: Self-pay | Admitting: Cardiovascular Disease

## 2024-02-01 NOTE — Progress Notes (Signed)
 Cardiology Office Note    Date:  02/02/2024   ID:  [name], DOB 04-16-1961, MRN [id number]  PCP:  Shaun Bickers, MD  Cardiologist: Shaun Mediate, MD    No chief complaint on file.   History of Present Illness:    [name] is a 63 y.o. 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 Dx 12/2010 - briefly on coumadin in 2012, CHADS2 = 2/CHA2DS2VASc = 3;  b. No recurrence, on Multaq ;  c. 08/2012 Echo: EF 55-60%.   CAD (coronary artery disease)    a. 01/2005 NSTEMI/PCI: LM nl, LAD nl, RI 20 ost, LCX nl, RCA 30p/25m (3.5x23 Cypher DES), EF 60%   CHF (congestive heart failure) (HCC)    Diabetes mellitus, type II (HCC)    HTN (hypertension)    Hyperlipidemia    Nephrolithiasis    a. s/p lithotripsy   Obesity    Ureteral stone 10/01/2013    Past Surgical History:  Procedure Laterality Date    stents  09/13/2004   cardiac stents   BIOPSY  06/01/2021   Procedure: BIOPSY;  Surgeon: Shaun Greening, DO;  Location: [facility];  Service: Endoscopy;;   CARDIAC CATHETERIZATION  01/13/2005   LEFT HEART   carpel  tunnel Bilateral    [name]   COLONOSCOPY WITH PROPOFOL  N/A 06/01/2021   Non-bleeding internal hemorrhoids, one 2 mm polyp at transverse colon s/p removal, 8 mm polyp in transverse colon, three 5-8 mm polyps in sigmoid colon. Tubular adenomas.  3 year surveillance.   LEFT HEART CATHETERIZATION  05/12/2007   LEFT HEART CATHETERIZATION WITH CORONARY ANGIOGRAM N/A 06/27/2014   Procedure: LEFT HEART CATHETERIZATION WITH CORONARY ANGIOGRAM;  Surgeon: Shaun Rule, MD;  Location: [facility] CATH LAB;  Service: Cardiovascular;  Laterality: N/A;   POLYPECTOMY  06/01/2021   Procedure: POLYPECTOMY;  Surgeon: Shaun Greening, DO;  Location: [facility];  Service: Endoscopy;;    Current Medications: Outpatient Medications Prior to Visit  Medication Sig Dispense Refill   acetaminophen  (TYLENOL ) 500 MG tablet Take 1,000 mg by mouth every 8 (eight) hours as needed for moderate pain.     aspirin  EC 81 MG tablet Take 81 mg by mouth daily.     cetirizine (ZYRTEC) 10 MG tablet Take 10 mg by mouth daily as needed for allergies.     Cinnamon 500 MG TABS Take 1,000 mg by mouth 2 (two) times daily.      clopidogrel  (PLAVIX ) 75 MG tablet Take 1  tablet (75 mg total) by mouth daily. 90 tablet 1   CRESTOR 20 MG tablet Take 20 mg by mouth every evening.      diphenhydrAMINE  (BENADRYL ) 25 MG tablet Take 25 mg by mouth at bedtime.     Empagliflozin-linaGLIPtin (GLYXAMBI) 25-5 MG TABS      hydrocortisone  (PROCTOZONE -HC) 2.5 % rectal cream Place 1 application rectally 2 (two) times daily. 30 g 3   ibuprofen (ADVIL) 200 MG tablet Take 400 mg by mouth every 6 (six) hours as needed for moderate pain.     losartan  (COZAAR ) 25 MG tablet TAKE 1 TABLET BY MOUTH DAILY 30 tablet 0   metFORMIN (GLUCOPHAGE) 1000 MG tablet Take 1,000 mg by mouth daily with breakfast.     metoprolol  succinate (TOPROL -XL) 100 MG 24 hr tablet TAKE 1 TABLET BY MOUTH ONCE DAILY. 90 tablet 2   MULTAQ  400 MG tablet TAKE (1) TABLET BY MOUTH TWICE DAILY  WITH A MEAL. 60 tablet 9   niacin (NIASPAN) 1000 MG CR tablet Take 1,000 mg by mouth at bedtime.      nitroGLYCERIN  (NITROSTAT ) 0.4 MG SL tablet PLACE ONE TBALET UNDER THE TONGUE EVERY 5 MINUTES AS NEEDED FOR CHEST PAIN. 25 tablet 0   Omega-3 Fatty Acids (FISH OIL) 1200 MG CAPS Take 1,200 mg by mouth 2 (two) times daily.     Empagliflozin-Linaglip-Metform (TRIJARDY XR) 12.5-2.01-999 MG TB24      No facility-administered medications prior to visit.     Allergies:   Patient has no known allergies.   Social History   Socioeconomic History   Marital status: Married    Spouse name: Not on file   Number of children: Not on file   Years of education: Not on file   Highest education level: Not on file  Occupational History   Not on file  Tobacco Use   Smoking status: Former    Current packs/day: 0.00    Types: Cigarettes    Start date: 09/22/1996    Quit date: 09/22/2004    Years since quitting: 19.3   Smokeless tobacco: Never  Vaping Use   Vaping status: Never Used  Substance and Sexual Activity   Alcohol use: No    Alcohol/week: 0.0 standard drinks of alcohol   Drug use: No   Sexual activity: Yes  Other Topics Concern   Not on file  Social History Narrative   Lives with wife in [place].  Works in Ambulance person - active at work. Also walks regularly with wife.   Social Drivers of Corporate investment banker Strain: Not on file  Food Insecurity: Not on file  Transportation Needs: Not on file  Physical Activity: Not on file  Stress: Not on file  Social Connections: Not on file     Family History:  The patient's family history includes Hypertension in his mother.   Review of Systems:   Please see the history of present illness.     General:  No chills, fever, night sweats or weight changes.  Cardiovascular:  No chest pain, edema, orthopnea, palpitations, paroxysmal nocturnal dyspnea. Positive for dyspnea on exertion.  Dermatological: No rash,  lesions/masses Respiratory: No cough, dyspnea Urologic: No hematuria, dysuria Abdominal:   No nausea, vomiting, diarrhea, bright red blood per rectum, melena, or hematemesis Neurologic:  No visual changes, wkns, changes in mental status. All other systems reviewed and are otherwise negative except as noted above.   Physical Exam:    Affect appropriate Healthy:  appears stated age HEENT:  normal Neck supple with no adenopathy JVP normal no bruits no thyromegaly Lungs clear with no wheezing and good diaphragmatic motion Heart:  S1/S2 no murmur, no rub, gallop or click PMI normal Abdomen: benighn, BS positve, no tenderness, no AAA no bruit.  No HSM or HJR Distal pulses intact with no bruits Trace edema some varicosities inner part of left calf  Neuro non-focal Skin warm and dry No muscular weakness   Wt Readings from Last 3 Encounters:  02/02/24 238 lb (108 kg)  10/29/22 237 lb 3.2 oz (107.6 kg)  11/10/21 245 lb 12.8 oz (111.5 kg)     Studies/Labs Reviewed:   EKG: 02/02/2024 SR old IMI no acute changes   Recent Labs: No results found for requested labs within last 365 days.   Lipid Panel    Component Value Date/Time   CHOL 90 01/24/2013 0732   TRIG 129 01/24/2013 0732   HDL 30 (L) 01/24/2013 0732   CHOLHDL 3.0 01/24/2013 0732   VLDL 26 01/24/2013 0732   LDLCALC 34 01/24/2013 0732    Additional studies/ records that were reviewed today include:   Myovue:  10/16/19 Normal  No ischemia EF 53%   Cardiac Catheterization: 06/2014 Coronary angiography: Coronary dominance: right   Left mainstem:  Normal   Left anterior descending (LAD):  Normal                          D1: Normal             D2 Small and normal   Left circumflex (LCx):  Codominant  Normal               OM1: normal             OM2: normal             OM3 normal   Right coronary artery (RCA):  30% tubular proximal  30% diffuse instent restenosis in mid vessel                 PDA:  Small  and normal             PLA:  Small and normal   Left ventriculography: Left ventricular systolic function is normal, LVEF is estimated at 55-65%, there is no significant mitral regurgitation      Recommendations:  No stenotic lesions Stent patent  No new left sided lesions  Continue medical Rx  Assessment:    CAD  Plan:   In order of problems listed above:  1. CAD/Dyspnea on Exertion - he is s/p DES to RCA in 2006 with patent stent by repeat cath in 2015. - . Normal myovue 10/16/19 no ischemia EF 53% - continue ASA, Plavix , BB and statin therapy.   2. History of Atrial Flutter - maintaining NSR Continue current regimen with Toprol -XL 100mg  daily and Multaq  400mg  BID. No longer on anticoagulation given no recent occurrence. He will have to stop Multaq  if doesn't get patient assistance Re submitted with diagnosis of afib rather than flutter   3. HTN - Well controlled.  Continue current medications and low sodium Dash type diet.     4. HLD - followed by PCP. Will request most recent labs. Goal LDL is less than 70 with known CAD. Continue Crestor 20mg  daily.   5. Varicosity:  discussed elevation legs and compression stocking for left calf as needed   6. GI:  colon polyps and hemorrhoids It would be ok  for him to hold plavix  for any banding procedure Repeat colonoscopy due in 2025    F/U in a year   Signed, Shaun Mediate, MD  02/02/2024 2:11 PM    [facility] [address] [place], [place] [address] Phone: [phone] Fax: [phone]

## 2024-02-02 ENCOUNTER — Encounter: Payer: Self-pay | Admitting: Cardiovascular Disease

## 2024-02-02 ENCOUNTER — Ambulatory Visit: Payer: Self-pay | Attending: Cardiovascular Disease | Admitting: Cardiovascular Disease

## 2024-02-02 VITALS — BP 138/86 | HR 72 | Ht 72.0 in | Wt 238.0 lb

## 2024-02-02 DIAGNOSIS — I10 Essential (primary) hypertension: Secondary | ICD-10-CM

## 2024-02-02 DIAGNOSIS — I4892 Unspecified atrial flutter: Secondary | ICD-10-CM

## 2024-02-02 DIAGNOSIS — I2510 Atherosclerotic heart disease of native coronary artery without angina pectoris: Secondary | ICD-10-CM

## 2024-02-02 NOTE — Patient Instructions (Signed)
 Medication Instructions:  Your physician recommends that you continue on your current medications as directed. Please refer to the Current Medication list given to you today.  *If you need a refill on your cardiac medications before your next appointment, please call your pharmacy*  Lab Work: None If you have labs (blood work) drawn today and your tests are completely normal, you will receive your results only by: MyChart Message (if you have MyChart) OR A paper copy in the mail If you have any lab test that is abnormal or we need to change your treatment, we will call you to review the results.  Testing/Procedures: None  Follow-Up: At [facility], you and your health needs are our priority.  As part of our continuing mission to provide you with exceptional heart care, our providers are all part of one team.  This team includes your primary Cardiologist (physician) and Advanced Practice Providers or APPs (Physician Assistants and Nurse Practitioners) who all work together to provide you with the care you need, when you need it.  Your next appointment:   1 year(s)  Provider:   You may see Janelle Mediate, MD or one of the following Advanced Practice Providers on your designated Care Team:   Woodfin Hays, PA-C  [place], [place] Theotis Flake, [place]     We recommend signing up for the patient portal called "MyChart".  Sign up information is provided on this After Visit Summary.  MyChart is used to connect with patients for Virtual Visits (Telemedicine).  Patients are able to view lab/test results, encounter notes, upcoming appointments, etc.  Non-urgent messages can be sent to your provider as well.   To learn more about what you can do with MyChart, go to [email or web address].   Other Instructions

## 2024-03-01 ENCOUNTER — Other Ambulatory Visit: Payer: Self-pay | Admitting: Cardiovascular Disease

## 2024-03-07 ENCOUNTER — Other Ambulatory Visit: Payer: Self-pay | Admitting: Cardiovascular Disease

## 2024-04-19 ENCOUNTER — Encounter (INDEPENDENT_AMBULATORY_CARE_PROVIDER_SITE_OTHER): Payer: Self-pay | Admitting: *Deleted

## 2024-04-21 ENCOUNTER — Other Ambulatory Visit: Payer: Self-pay | Admitting: Cardiovascular Disease

## 2024-04-28 LAB — LAB REPORT - SCANNED
A1c: 6.9
Albumin, Urine POC: <3
Creatinine, POC: 107.3 mg/dL
EGFR: 69
Microalb Creat Ratio: <3

## 2024-05-08 ENCOUNTER — Telehealth: Payer: Self-pay | Admitting: *Deleted

## 2024-05-08 NOTE — Telephone Encounter (Signed)
 Called to notify pt that Multaq  has arrived in office and is available to be picked up. Pt voiced understanding.
